# Patient Record
Sex: Male | Born: 1945 | Race: White | Hispanic: No | Marital: Married | Smoking: Former smoker
Health system: Southern US, Community
[De-identification: ages and names within clinical notes are randomized; demographics above are authoritative.]

## PROBLEM LIST (undated history)

## (undated) DIAGNOSIS — L920 Granuloma annulare: Secondary | ICD-10-CM

## (undated) DIAGNOSIS — I10 Essential (primary) hypertension: Secondary | ICD-10-CM

## (undated) DIAGNOSIS — E785 Hyperlipidemia, unspecified: Secondary | ICD-10-CM

## (undated) DIAGNOSIS — T7840XA Allergy, unspecified, initial encounter: Secondary | ICD-10-CM

## (undated) DIAGNOSIS — D229 Melanocytic nevi, unspecified: Secondary | ICD-10-CM

## (undated) DIAGNOSIS — C4491 Basal cell carcinoma of skin, unspecified: Secondary | ICD-10-CM

## (undated) DIAGNOSIS — R609 Edema, unspecified: Secondary | ICD-10-CM

## (undated) HISTORY — DX: Melanocytic nevi, unspecified: D22.9

## (undated) HISTORY — PX: NASAL SEPTUM SURGERY: SHX37

## (undated) HISTORY — DX: Hyperlipidemia, unspecified: E78.5

## (undated) HISTORY — DX: Edema, unspecified: R60.9

## (undated) HISTORY — DX: Granuloma annulare: L92.0

## (undated) HISTORY — PX: TONSILLECTOMY: SUR1361

## (undated) HISTORY — DX: Essential (primary) hypertension: I10

## (undated) HISTORY — DX: Allergy, unspecified, initial encounter: T78.40XA

---
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
## 2002-02-06 HISTORY — PX: CERVICAL DISC SURGERY: SHX588

## 2002-03-11 ENCOUNTER — Encounter: Payer: Self-pay | Admitting: Internal Medicine

## 2002-03-11 ENCOUNTER — Encounter: Admission: RE | Admit: 2002-03-11 | Discharge: 2002-03-11 | Payer: Self-pay | Admitting: Internal Medicine

## 2002-06-03 HISTORY — PX: COLONOSCOPY: SHX174

## 2003-08-21 ENCOUNTER — Encounter: Admission: RE | Admit: 2003-08-21 | Discharge: 2003-08-21 | Payer: Self-pay | Admitting: Internal Medicine

## 2003-08-24 ENCOUNTER — Encounter: Admission: RE | Admit: 2003-08-24 | Discharge: 2003-08-24 | Payer: Self-pay | Admitting: Internal Medicine

## 2004-09-14 ENCOUNTER — Encounter: Admission: RE | Admit: 2004-09-14 | Discharge: 2004-09-14 | Payer: Self-pay | Admitting: Internal Medicine

## 2004-10-20 ENCOUNTER — Ambulatory Visit (HOSPITAL_COMMUNITY): Admission: RE | Admit: 2004-10-20 | Discharge: 2004-10-20 | Payer: Self-pay | Admitting: Orthopedic Surgery

## 2004-10-20 ENCOUNTER — Ambulatory Visit (HOSPITAL_BASED_OUTPATIENT_CLINIC_OR_DEPARTMENT_OTHER): Admission: RE | Admit: 2004-10-20 | Discharge: 2004-10-20 | Payer: Self-pay | Admitting: Orthopedic Surgery

## 2005-04-20 ENCOUNTER — Ambulatory Visit (HOSPITAL_COMMUNITY): Admission: RE | Admit: 2005-04-20 | Discharge: 2005-04-20 | Payer: Self-pay | Admitting: Orthopedic Surgery

## 2005-06-06 ENCOUNTER — Inpatient Hospital Stay (HOSPITAL_COMMUNITY): Admission: RE | Admit: 2005-06-06 | Discharge: 2005-06-07 | Payer: Self-pay | Admitting: Neurosurgery

## 2006-02-06 HISTORY — PX: KNEE SURGERY: SHX244

## 2008-01-06 ENCOUNTER — Ambulatory Visit: Payer: Self-pay | Admitting: Internal Medicine

## 2008-03-20 ENCOUNTER — Ambulatory Visit: Payer: Self-pay | Admitting: Internal Medicine

## 2008-09-24 ENCOUNTER — Ambulatory Visit: Payer: Self-pay | Admitting: Internal Medicine

## 2009-03-15 ENCOUNTER — Ambulatory Visit: Payer: Self-pay | Admitting: Internal Medicine

## 2009-04-29 ENCOUNTER — Ambulatory Visit (HOSPITAL_BASED_OUTPATIENT_CLINIC_OR_DEPARTMENT_OTHER): Admission: RE | Admit: 2009-04-29 | Discharge: 2009-04-30 | Payer: Self-pay | Admitting: Orthopedic Surgery

## 2009-04-29 HISTORY — PX: SHOULDER ARTHROSCOPY: SHX128

## 2009-07-12 ENCOUNTER — Ambulatory Visit: Payer: Self-pay | Admitting: Internal Medicine

## 2009-07-14 ENCOUNTER — Ambulatory Visit: Payer: Self-pay | Admitting: Internal Medicine

## 2010-03-01 ENCOUNTER — Ambulatory Visit
Admission: RE | Admit: 2010-03-01 | Discharge: 2010-03-01 | Payer: Self-pay | Source: Home / Self Care | Attending: Internal Medicine | Admitting: Internal Medicine

## 2010-05-01 LAB — BASIC METABOLIC PANEL
BUN: 12 mg/dL (ref 6–23)
Calcium: 9.2 mg/dL (ref 8.4–10.5)
Creatinine, Ser: 0.79 mg/dL (ref 0.4–1.5)

## 2010-05-01 LAB — POCT HEMOGLOBIN-HEMACUE: Hemoglobin: 18 g/dL — ABNORMAL HIGH (ref 13.0–17.0)

## 2010-06-24 NOTE — Op Note (Signed)
NAMEHERACLIO, [name]               ACCOUNT NO.:  [id number]   MEDICAL RECORD NO.:  [id number]          PATIENT TYPE:  INP   LOCATION:  3011                         FACILITY:  [facility]   PHYSICIAN:  Payton Doughty, M.D.      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   OPERATIVE PROCEDURE:  C4-5 and C5-6 anterior cervical decompression and  fusion with a reflex hybrid plate.   DICTATING DOCTOR:  Payton Doughty, M.D.   SERVICE:  Neurosurgery.   ANESTHESIA:  General endotracheal.   PREPARATION:  Prepped with alcohol wipe.   COMPLICATIONS:  None.   No assistant.   A 65 year old gentleman with disk disease at 4-5 and 5-6.  Taken to the  operating room __________ intubated, placed supine on the operating table in  the halter head traction.  Following shaving, prep and drape in the usual  sterile fashion, the skin was incised from the midline to the medial border  of the sternocleidomastoid muscle, slightly above the level of carotid  tubercle.  The platysma was identified, elevated, divided and undermined.  The sternocleidomastoid was identified.  Medial dissection revealed the  carotid artery retracted on the left, trachea and esophagus retracted  laterally on the right.  This exposed the bones on the anterior cervical  spine.  Markers were placed, intraoperative x-ray obtained to confirm  pharynx level.  Having confirmed pharynx level, the longus coli was taken  down bilaterally and the shadow line self-retaining retractor placed.  Diskectomy was carried out at 4-5 and 5-6 under gross observation.  The  operating microscope was then brought in.  We used microdissection technique  to remove the remaining disk to dissect the neuroforamen and we divided the  posterior longitudinal  ligament.  At 4-5, there was disk mostly off to the  left side with osteophyte bilaterally.  At 5-6, the disk was more off to the  right.  Following complete dissection of both neuroforamen at each level,  the wound was irrigated, hemostasis assured.  The 7 mm bone grafts were  fashioned at patellar allograft and tapped into place at each level.  A 34  mm reflex hybrid plate was then placed with 12 mm screws, two in C4, two in  C5, and two in C6.  Intraoperative x-rays showed good placement of bone  grafts, plate and screws.  The wound was once again irrigated and hemostasis  ensured.  The platysma and subcutaneous tissues were reapproximated with #3-  0 Vicryl in an interrupted fashion.  The skin was closed with #4-0 Vicryl in a running subcuticular fashion.  Benzoin and Steri-Strips were placed, __________ Telfa and Op-Site and the  patient placed in an Aspen collar and returned to the recovery room in good  condition.           ______________________________  Payton Doughty, M.D.  MWR/MEDQ  D:  06/06/2005  T:  06/06/2005  Job:  [id number]

## 2010-06-24 NOTE — H&P (Signed)
Harold [name], Harold [name]               ACCOUNT NO.:  [id number]   MEDICAL RECORD NO.:  [id number]          PATIENT TYPE:  INP   LOCATION:  NA                           FACILITY:  [facility]   PHYSICIAN:  Payton Doughty, M.D.      DATE OF BIRTH:  1946-01-11   DATE OF ADMISSION:  06/06/2005  DATE OF DISCHARGE:                                HISTORY & PHYSICAL   ADMISSION DIAGNOSIS:  Cervical spondylosis at C4-5, C5-6.   BODY OF TEXT:  A very nice 65 year old right-handed white gentleman who  sometimes has been having pain out toward his right shoulder and it has  probably been a couple of months.  Tingling and dysesthesias in the right  arm are present.  MR demonstrates spondylosis at C5-6 and C4-5, and he is  now admitted for an anterior decompression and fusion.   PAST MEDICAL HISTORY:  Benign.   He uses Zoloft 50 mg a day, Ritalin 10 mg a day on a p.r.n. basis, Tranxene  7.5 mg on a p.r.n. basis, loratadine 10 mg on a p.r.n. basis, __________ on  a p.r.n. basis.   He has no allergies.   SURGICAL HISTORY:  Medial meniscus by Dr. Thurston Hole in September 2006.   SOCIAL HISTORY:  He does not smoke, is a social drinker, and Museum/gallery conservator.   FAMILY HISTORY:  His mother and father are both deceased.   REVIEW OF SYSTEMS:  Remarkable for glasses, leg pain, jaundice, back pain,  neck pain, arm pain.   PHYSICAL EXAMINATION:  HEENT:  Within normal limits.  NECK:  He has limited range of motion of his neck turning toward the right.  CHEST:  Clear.  CARDIAC:  Regular rate and rhythm.  ABDOMEN:  Nontender with no hepatosplenomegaly.  EXTREMITIES:  Without clubbing or cyanosis.  GENITOURINARY:  Deferred.  VASCULAR:  Peripheral pulses are good.  NEUROLOGIC:  He is awake, alert and oriented.  His cranial nerves are  intact.  Motor exam shows 5/5 strength throughout the upper extremities.  Sensory dysesthesia is described in a right C6 distribution.  Deep tendon  reflexes are 1 at the right biceps,  2 at the left, 2 at the left triceps, 1  at the right triceps, bilaterally 1 at the brachioradialis.  Hoffman's is  negative.   He comes accompanied with an MRI that demonstrates spondylitic disease,  worse to the left at C4-5, worse to the right at C5-6, both with neural  foraminal compromise and rotation and flattening of the cord.   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   PLAN:  Anterior cervical decompression and fusion at C4-5 and C5-6.  The  risks and benefits of this approach have been discussed with him, and he  wishes to proceed.           ______________________________  Payton Doughty, M.D.     MWR/MEDQ  D:  06/06/2005  T:  06/06/2005  Job:  [id number]

## 2010-06-24 NOTE — Op Note (Signed)
NAMEJUSTAN, GAEDE               ACCOUNT NO.:  [id number]   MEDICAL RECORD NO.:  [id number]          PATIENT TYPE:  AMB   LOCATION:  DSC                          FACILITY:  [facility]   PHYSICIAN:  Loreta Ave, M.D. DATE OF BIRTH:  26-Feb-1945   DATE OF PROCEDURE:  10/20/2004  DATE OF DISCHARGE:                                 OPERATIVE REPORT   PREOPERATIVE DIAGNOSIS:  Right knee medial and lateral meniscal tear.   POSTOPERATIVE DIAGNOSIS:  Right knee medial and lateral meniscal tear.  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   OPERATION PERFORMED:  Right knee examination under anesthesia, arthroscopy,  partial medial and lateral meniscectomy.  Chondroplasty and microfracturing  of medial femoral condyle.   SURGEON:  Loreta Ave, M.D.   ASSISTANT:  Genene Churn. Denton Meek.   ANESTHESIA:  General.   ESTIMATED BLOOD LOSS:  Minimal.   SPECIMENS:  None.   CULTURES:  None.   COMPLICATIONS:  None.   DRESSING:  Soft compressive.   DESCRIPTION OF PROCEDURE:  The patient was brought to the operating room and  after adequate anesthesia had been obtained, the knee examined.  Full  motion.  Good stability.  Positive medial McMurray's.  Tourniquet and leg  holder applied.  Leg prepped and draped in the usual sterile fashion.  Three  portals were created, one superolateral, one each medial and lateral  parapatellar.  Inflow catheter introduced.  Knee distended. Arthroscope  introduced, knee inspected.  Patellofemoral joint minimal chondral change.  Large fibrotic medial plica resected.  Hypertrophic synovitis removed.  Lateral meniscus small radial tear, midportion debrided.  Lateral  compartment only had grade 1 changes, nothing marked.  Cruciate ligament was  intact.  Medial meniscus extensive complex tearing entire posterior half  numerous displaced fragments and degeneration.   Posterior half removed,  tapered in to remaining meniscus.  Plateau looked reasonably good, only  grade 2 changes but the condyle had extensive grade 3 and 4 changes over the  entire lateral half through full motion.  Chondral flaps and loose fragments  debrided.  Exposed bone over much of this area.  Treated with multiple  microfracturing.  Pressure reduced in the knee to confirm good bleeding out  of the microfracturing holes.  Pressure increased and all loose fragments  removed  throughout the knee.  Instruments and fluid removed.  Portals of the knee  injected with Marcaine.  Portals were closed with 4-0 nylon.  Sterile  compressive dressing applied.  Anesthesia reversed.  Brought to recovery  room.  Tolerated surgery well.  No complications.      Loreta Ave, M.D.  Electronically Signed     DFM/MEDQ  D:  10/20/2004  T:  10/20/2004  Job:  [id number]

## 2010-09-02 ENCOUNTER — Other Ambulatory Visit: Payer: Self-pay

## 2010-09-02 MED ORDER — HYZAAR 100-25 MG PO TABS: 1.0000 | ORAL_TABLET | Freq: Every day | ORAL | Status: DC

## 2010-09-19 ENCOUNTER — Other Ambulatory Visit: Payer: Self-pay | Admitting: *Deleted

## 2010-09-20 MED ORDER — METOPROLOL TARTRATE 50 MG PO TABS: 50.0000 mg | ORAL_TABLET | Freq: Every day | ORAL | Status: DC

## 2010-12-13 ENCOUNTER — Other Ambulatory Visit: Payer: Self-pay

## 2010-12-13 MED ORDER — HYZAAR 100-25 MG PO TABS: 1.0000 | ORAL_TABLET | Freq: Every day | ORAL | Status: DC

## 2010-12-21 ENCOUNTER — Other Ambulatory Visit: Payer: Self-pay

## 2010-12-21 DIAGNOSIS — H109 Unspecified conjunctivitis: Secondary | ICD-10-CM

## 2010-12-21 DIAGNOSIS — B009 Herpesviral infection, unspecified: Secondary | ICD-10-CM | POA: Insufficient documentation

## 2010-12-21 DIAGNOSIS — I10 Essential (primary) hypertension: Secondary | ICD-10-CM | POA: Insufficient documentation

## 2010-12-21 DIAGNOSIS — F419 Anxiety disorder, unspecified: Secondary | ICD-10-CM | POA: Insufficient documentation

## 2010-12-21 MED ORDER — ACYCLOVIR 5 % EX CREA: 1.0000 "application " | TOPICAL_CREAM | CUTANEOUS | Status: AC

## 2010-12-21 NOTE — Telephone Encounter (Signed)
Pt called complaining of conjunctivitis symptoms this afternoon. Call in to [facility] Ofloxacin opthalmic drops Sig:2 drops o.u. 4 times daily x 5 days. If no better in 48 hours is to see eye doctor. MJB

## 2011-03-17 ENCOUNTER — Other Ambulatory Visit: Payer: Self-pay | Admitting: Internal Medicine

## 2011-03-17 ENCOUNTER — Encounter: Payer: Self-pay | Admitting: Internal Medicine

## 2011-03-20 ENCOUNTER — Ambulatory Visit (INDEPENDENT_AMBULATORY_CARE_PROVIDER_SITE_OTHER): Payer: PRIVATE HEALTH INSURANCE | Admitting: Internal Medicine

## 2011-03-20 VITALS — BP 116/72 | HR 80 | Resp 12 | Ht 73.5 in | Wt 277.0 lb

## 2011-03-20 DIAGNOSIS — Z23 Encounter for immunization: Secondary | ICD-10-CM

## 2011-03-20 DIAGNOSIS — I10 Essential (primary) hypertension: Secondary | ICD-10-CM

## 2011-03-20 DIAGNOSIS — Z0000 Encounter for general adult medical examination without abnormal findings: Secondary | ICD-10-CM

## 2011-03-20 LAB — POCT URINALYSIS DIPSTICK
Ketones, UA: NEGATIVE
Leukocytes, UA: NEGATIVE
Protein, UA: NEGATIVE
Spec Grav, UA: 1.025
Urobilinogen, UA: NEGATIVE
pH, UA: 6

## 2011-03-20 LAB — COMPREHENSIVE METABOLIC PANEL
Albumin: 4.4 g/dL (ref 3.5–5.2)
Calcium: 10.1 mg/dL (ref 8.4–10.5)
Chloride: 101 mEq/L (ref 96–112)
Potassium: 4.4 mEq/L (ref 3.5–5.3)
Total Bilirubin: 1.1 mg/dL (ref 0.3–1.2)
Total Protein: 6.5 g/dL (ref 6.0–8.3)

## 2011-03-20 LAB — CBC WITH DIFFERENTIAL/PLATELET
Basophils Absolute: 0 10*3/uL (ref 0.0–0.1)
Basophils Relative: 1 % (ref 0–1)
Eosinophils Absolute: 0.6 10*3/uL (ref 0.0–0.7)
Eosinophils Relative: 8 % — ABNORMAL HIGH (ref 0–5)
Lymphocytes Relative: 36 % (ref 12–46)
Lymphs Abs: 2.7 10*3/uL (ref 0.7–4.0)
MCH: 31.7 pg (ref 26.0–34.0)
MCHC: 34.6 g/dL (ref 30.0–36.0)
Monocytes Relative: 10 % (ref 3–12)
Neutro Abs: 3.5 10*3/uL (ref 1.7–7.7)
Neutrophils Relative %: 46 % (ref 43–77)
Platelets: 244 10*3/uL (ref 150–400)

## 2011-03-20 LAB — LIPID PANEL
HDL: 44 mg/dL (ref >39)
Triglycerides: 137 mg/dL (ref <150)
VLDL: 27 mg/dL (ref 0–40)

## 2011-03-31 ENCOUNTER — Other Ambulatory Visit: Payer: Self-pay

## 2011-03-31 MED ORDER — VALACYCLOVIR HCL 500 MG PO TABS: 500.0000 mg | ORAL_TABLET | Freq: Two times a day (BID) | ORAL | Status: AC

## 2011-04-13 ENCOUNTER — Other Ambulatory Visit: Payer: Self-pay

## 2011-04-13 MED ORDER — METOPROLOL TARTRATE 50 MG PO TABS: 50.0000 mg | ORAL_TABLET | Freq: Every day | ORAL | Status: DC

## 2011-05-08 ENCOUNTER — Encounter: Payer: Self-pay | Admitting: Internal Medicine

## 2011-05-08 NOTE — Progress Notes (Signed)
  Subjective:    Patient ID: Harold [name], male    DOB: May 07, 1945, 66 y.o.   MRN: [id number]  HPI and 66 year old white male for welcome to Medicare physical examination. History of hypertension, hyperlipidemia, vitamin D deficiency, allergic rhinitis. History of granuloma annulare. History of hepatitis A 1965, history of fractured nose age 5. Tonsillectomy 1952, had a birth mark removed in the remote past, septal deviation surgery by Dr. Margit Banda February 2004, cervical disc surgery April 2007. Had colonoscopy April 2004. Blood pressure is well-controlled on beta blocker and Hyzaar. History of anxiety. History of herpes simplex type I.  He has a health care power of attorney.  He is president of, [employer]. He is married. One son in good health.  Father died at age 27 of an MI with history of diabetes and hypertension. Mother died of Alzheimer's disease complications. One brother in good health. One sister with nephrotic syndrome. Patient is a Buyer, retail of [employer]. Enjoys golf. Social alcohol consumption. Does not smoke. He used to smoke but quit proximally 1999.    Review of Systems noncontributory. Feels well.     Objective:   Physical Exam HEENT exam: Extraocular movements are full, pharynx is clear, TMs are clear, PERRLA, neck: Supple without thyromegaly or carotid bruits or adenopathy. Chest clear to auscultation; cardiac exam regular rate and rhythm normal S1 and S2 without murmurs or gallops; abdomen no hepatosplenomegaly masses or tenderness; prostate exam is normal; extremities without edema; neuro no focal deficits.        Assessment & Plan:  Hypertension  History of hyperlipidemia  History of vitamin D deficiency  History of allergic rhinitis  History of granuloma annular he  Plan: Return in 6 months for office visit, lipid panel blood pressure check. Recommend diet and exercise.

## 2011-05-08 NOTE — Patient Instructions (Signed)
Please try the diet exercise and lose a bit of weight. Return in 6 months.

## 2011-05-16 ENCOUNTER — Other Ambulatory Visit: Payer: Self-pay

## 2011-05-16 MED ORDER — HYZAAR 100-25 MG PO TABS: 1.0000 | ORAL_TABLET | Freq: Every day | ORAL | Status: DC

## 2011-06-20 ENCOUNTER — Encounter: Payer: Self-pay | Admitting: Internal Medicine

## 2011-06-20 ENCOUNTER — Ambulatory Visit (INDEPENDENT_AMBULATORY_CARE_PROVIDER_SITE_OTHER): Payer: PRIVATE HEALTH INSURANCE | Admitting: Internal Medicine

## 2011-06-20 VITALS — BP 128/76 | HR 92 | Temp 98.1°F | Wt 272.0 lb

## 2011-06-20 DIAGNOSIS — J069 Acute upper respiratory infection, unspecified: Secondary | ICD-10-CM

## 2011-06-20 NOTE — Progress Notes (Signed)
  Subjective:    Patient ID: Harold [name], male    DOB: [birth year], 66 y.o.   MRN: [id number]  HPI Patient has had URI symptoms for 3 weeks. Has cough prickly in the mornings that is sometimes productive. Some runny nose. Symptoms are wearing him down and are not getting any better. No fever or shaking chills. Main complaint is cough and congestion.    Review of Systems     Objective:   Physical Exam HEENT exam: TMs are clear; pharynx clear; neck is supple without significant adenopathy; chest clear. Sounds nasally congested        Assessment & Plan:  URI  Plan: Levaquin 500 milligrams daily for 7 days. Call back if not better in 7 days.

## 2011-06-20 NOTE — Patient Instructions (Signed)
Take Levaquin 500 milligrams daily for 7 days. Call if not better in one week. 

## 2011-06-22 ENCOUNTER — Ambulatory Visit: Payer: PRIVATE HEALTH INSURANCE | Admitting: Internal Medicine

## 2011-09-21 ENCOUNTER — Encounter: Payer: Self-pay | Admitting: Internal Medicine

## 2011-09-21 ENCOUNTER — Ambulatory Visit (INDEPENDENT_AMBULATORY_CARE_PROVIDER_SITE_OTHER): Payer: PRIVATE HEALTH INSURANCE | Admitting: Internal Medicine

## 2011-09-21 VITALS — BP 124/70 | HR 76 | Temp 97.0°F | Ht 74.0 in | Wt 272.0 lb

## 2011-09-21 DIAGNOSIS — J309 Allergic rhinitis, unspecified: Secondary | ICD-10-CM

## 2011-09-21 DIAGNOSIS — I10 Essential (primary) hypertension: Secondary | ICD-10-CM

## 2011-10-07 NOTE — Progress Notes (Signed)
  Subjective:    Patient ID: Harold [name], male    DOB: 11-Dec-1945, 66 y.o.   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 Blood pressure under good control on current regimen of metoprolol and Hyzaar 100/25 daily. Continues to have some issues with nasal congestion and stuffiness. Was seen for URI in May but never completely cleared up. He may need to have allergy testing with Dr. Beaulah Dinning  who is a friend of his.    Review of Systems     Objective:   Physical Exam He has boggy nasal mucosa. Pharynx is clear. TMs are slightly full bilaterally but not red. Neck is supple without adenopathy. Chest clear to auscultation. Cardiac exam regular rate and rhythm normal S1 and S2. Extremities without edema. Skin is warm and dry.        Assessment & Plan:  Allergic rhinitis  Hypertension-well-controlled on current regimen  Plan: Recommend allergy testing with Dr. Lavinia Sharps as in [facility]. Patient will call for appointment. Return in 6 months for physical examination.

## 2011-10-07 NOTE — Patient Instructions (Addendum)
Recommend allergy testing. Continue same medications and return in 6 months.

## 2012-01-12 ENCOUNTER — Telehealth: Payer: Self-pay

## 2012-01-12 DIAGNOSIS — N529 Male erectile dysfunction, unspecified: Secondary | ICD-10-CM

## 2012-01-12 MED ORDER — TADALAFIL 5 MG PO TABS: 5.0000 mg | ORAL_TABLET | Freq: Every day | ORAL | Status: DC | PRN

## 2012-01-12 NOTE — Telephone Encounter (Signed)
Patient requesting refill of Cialis prescribed by his urologist, who will not fill it without an OV. OK per Dr. Lenord Fellers to refill Cialis 20mg  as dir, with 2 refills

## 2012-03-28 ENCOUNTER — Other Ambulatory Visit: Payer: PRIVATE HEALTH INSURANCE | Admitting: Internal Medicine

## 2012-03-28 ENCOUNTER — Other Ambulatory Visit: Payer: Self-pay | Admitting: Internal Medicine

## 2012-03-28 DIAGNOSIS — Z0000 Encounter for general adult medical examination without abnormal findings: Secondary | ICD-10-CM

## 2012-03-28 LAB — COMPREHENSIVE METABOLIC PANEL
ALT: 25 U/L (ref 0–53)
AST: 17 U/L (ref 0–37)
Albumin: 4.2 g/dL (ref 3.5–5.2)
BUN: 23 mg/dL (ref 6–23)
Calcium: 9.6 mg/dL (ref 8.4–10.5)
Creat: 0.9 mg/dL (ref 0.50–1.35)
Sodium: 139 mEq/L (ref 135–145)
Total Protein: 6.5 g/dL (ref 6.0–8.3)

## 2012-03-28 LAB — CBC WITH DIFFERENTIAL/PLATELET
Basophils Absolute: 0 10*3/uL (ref 0.0–0.1)
Eosinophils Absolute: 0.3 10*3/uL (ref 0.0–0.7)
HCT: 47.2 % (ref 39.0–52.0)
Hemoglobin: 16.7 g/dL (ref 13.0–17.0)
Lymphocytes Relative: 41 % (ref 12–46)
MCV: 90.4 fL (ref 78.0–100.0)
Monocytes Absolute: 0.4 10*3/uL (ref 0.1–1.0)
WBC: 5.4 10*3/uL (ref 4.0–10.5)

## 2012-03-28 LAB — LIPID PANEL
Cholesterol: 137 mg/dL (ref 0–200)
HDL: 34 mg/dL — ABNORMAL LOW (ref >39)
LDL Cholesterol: 87 mg/dL (ref 0–99)

## 2012-03-29 ENCOUNTER — Ambulatory Visit (INDEPENDENT_AMBULATORY_CARE_PROVIDER_SITE_OTHER): Payer: PRIVATE HEALTH INSURANCE | Admitting: Internal Medicine

## 2012-03-29 ENCOUNTER — Encounter: Payer: Self-pay | Admitting: Internal Medicine

## 2012-03-29 VITALS — BP 114/68 | HR 68 | Temp 97.5°F | Ht 73.5 in | Wt 258.0 lb

## 2012-03-29 DIAGNOSIS — I10 Essential (primary) hypertension: Secondary | ICD-10-CM

## 2012-03-29 DIAGNOSIS — Z23 Encounter for immunization: Secondary | ICD-10-CM

## 2012-03-29 DIAGNOSIS — F411 Generalized anxiety disorder: Secondary | ICD-10-CM

## 2012-03-29 DIAGNOSIS — E669 Obesity, unspecified: Secondary | ICD-10-CM

## 2012-03-29 DIAGNOSIS — B009 Herpesviral infection, unspecified: Secondary | ICD-10-CM

## 2012-03-29 DIAGNOSIS — J309 Allergic rhinitis, unspecified: Secondary | ICD-10-CM

## 2012-03-29 LAB — HEMOGLOBIN A1C
Hgb A1c MFr Bld: 5.1 % (ref <5.7)
Mean Plasma Glucose: 100 mg/dL (ref <117)

## 2012-03-29 LAB — POCT URINALYSIS DIPSTICK
Glucose, UA: NEGATIVE
Ketones, UA: NEGATIVE
Urobilinogen, UA: NEGATIVE

## 2012-03-29 MED ORDER — TETANUS-DIPHTH-ACELL PERTUSSIS 5-2.5-18.5 LF-MCG/0.5 IM SUSP: 0.5000 mL | Freq: Once | INTRAMUSCULAR | Status: DC

## 2012-04-25 HISTORY — PX: CATARACT EXTRACTION, BILATERAL: SHX1313

## 2012-04-29 ENCOUNTER — Encounter: Payer: Self-pay | Admitting: Internal Medicine

## 2012-05-28 ENCOUNTER — Encounter: Payer: Self-pay | Admitting: Internal Medicine

## 2012-06-22 ENCOUNTER — Other Ambulatory Visit: Payer: Self-pay | Admitting: Internal Medicine

## 2012-08-02 ENCOUNTER — Ambulatory Visit (AMBULATORY_SURGERY_CENTER): Payer: PRIVATE HEALTH INSURANCE | Admitting: *Deleted

## 2012-08-02 ENCOUNTER — Encounter: Payer: Self-pay | Admitting: Internal Medicine

## 2012-08-02 ENCOUNTER — Encounter: Payer: Self-pay | Admitting: Cardiology

## 2012-08-02 VITALS — Ht 74.0 in | Wt 248.4 lb

## 2012-08-02 DIAGNOSIS — Z1211 Encounter for screening for malignant neoplasm of colon: Secondary | ICD-10-CM

## 2012-08-02 MED ORDER — MOVIPREP 100 G PO SOLR: ORAL | Status: DC

## 2012-08-15 ENCOUNTER — Encounter: Payer: Self-pay | Admitting: Internal Medicine

## 2012-08-16 ENCOUNTER — Encounter: Payer: PRIVATE HEALTH INSURANCE | Admitting: Internal Medicine

## 2012-08-18 ENCOUNTER — Encounter: Payer: Self-pay | Admitting: Internal Medicine

## 2012-08-18 DIAGNOSIS — E669 Obesity, unspecified: Secondary | ICD-10-CM | POA: Insufficient documentation

## 2012-08-18 DIAGNOSIS — J309 Allergic rhinitis, unspecified: Secondary | ICD-10-CM | POA: Insufficient documentation

## 2012-08-18 DIAGNOSIS — N529 Male erectile dysfunction, unspecified: Secondary | ICD-10-CM | POA: Insufficient documentation

## 2012-08-18 NOTE — Patient Instructions (Addendum)
Stop metoprolol. Continue Hyzaar. Return in 6 months. Gabapentin will be discontinued. Diet exercise and lose weight.

## 2012-08-18 NOTE — Progress Notes (Signed)
  Subjective:    Patient ID: Harold [name], male    DOB: Jun 24, 1945, 67 y.o.   MRN: [id number]  HPI 67 year old white male with history of hypertension in today for health maintenance exam and evaluation of medical problems. In addition hypertension has hyperlipidemia, vitamin D deficiency, allergic rhinitis. History of anxiety. History of herpes simplex type I.   Past medical history: History of hepatitis A in 1965. History of fractured nose at age 1. Tonsillectomy 1952. Had a birthmark removed in the remote past. Septal deviation surgery February 2004. Cervical disc surgery April 2007. Colonoscopy April 2004.  He has a healthcare power of attorney.  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 Resides in [facility]. He is married. One adult son in good health. Social alcohol consumption. Does not smoke. Used to smoke but quit approximately 1999. He is a Buyer, retail of [employer]. Enjoys golf.  Family history: Father died at age 2 of an MI with history of diabetes and hypertension. Mother died of complications of Alzheimer's disease. One brother in good health. One sister with history of nephrotic syndrome.  Tdap Vaccine 2004. Pneumovax vaccine 2013.    Review of Systems  Constitutional: Negative.   All other systems reviewed and are negative.       Objective:   Physical Exam  Vitals reviewed. Constitutional: He is oriented to person, place, and time. He appears well-developed and well-nourished. No distress.  HENT:  Head: Normocephalic and atraumatic.  Right Ear: External ear normal.  Left Ear: External ear normal.  Mouth/Throat: Oropharynx is clear and moist. No oropharyngeal exudate.  Eyes: Conjunctivae and EOM are normal. Pupils are equal, round, and reactive to light. Right eye exhibits no discharge. Left eye exhibits no discharge. No scleral icterus.  Neck: Neck supple. No JVD present. No thyromegaly present.  Cardiovascular: Normal rate, regular rhythm and normal  heart sounds.   No murmur heard. Pulmonary/Chest: Breath sounds normal. No respiratory distress. He has no wheezes. He has no rales. He exhibits no tenderness.  Abdominal: Soft. Bowel sounds are normal. He exhibits no distension and no mass. There is no tenderness. There is no rebound and no guarding.  Genitourinary: Prostate normal.  Musculoskeletal: Normal range of motion. He exhibits no edema.  Lymphadenopathy:    He has no cervical adenopathy.  Neurological: He is alert and oriented to person, place, and time. He has normal reflexes. No cranial nerve deficit. Coordination normal.  Skin: Skin is warm and dry. No rash noted. He is not diaphoretic.  Psychiatric: He has a normal mood and affect. His behavior is normal. Judgment and thought content normal.          Assessment & Plan:  Hypertension-see below.  Obesity-weight is increased 19 pounds. Needs to diet and exercise.  History of herpes simplex type I  History of anxiety  Erectile dysfunction-continue Cialis daily  Plan: Blood pressure is under good control at present time. Patient will discontinue metoprolol. He is going to stop gabapentin. Continue Hyzaar 100/25 daily. Return in 6 months.

## 2012-08-26 ENCOUNTER — Telehealth: Payer: Self-pay | Admitting: Internal Medicine

## 2012-08-26 NOTE — Telephone Encounter (Signed)
LMOM- I told pt to push his fluids tomorrow- drink a lot of extra water and to also follow his Moviprep instructions exactly.  Also, I told him to call office back tomorrow if he needs his prep instructions given to him

## 2012-08-28 ENCOUNTER — Ambulatory Visit (AMBULATORY_SURGERY_CENTER): Payer: PRIVATE HEALTH INSURANCE | Admitting: Internal Medicine

## 2012-08-28 ENCOUNTER — Encounter: Payer: Self-pay | Admitting: Internal Medicine

## 2012-08-28 VITALS — BP 124/82 | HR 56 | Temp 96.3°F | Resp 19 | Ht 74.0 in | Wt 248.0 lb

## 2012-08-28 DIAGNOSIS — Z1211 Encounter for screening for malignant neoplasm of colon: Secondary | ICD-10-CM

## 2012-08-28 DIAGNOSIS — D126 Benign neoplasm of colon, unspecified: Secondary | ICD-10-CM

## 2012-08-28 MED ORDER — SODIUM CHLORIDE 0.9 % IV SOLN: 500.0000 mL | INTRAVENOUS | Status: DC

## 2012-08-28 NOTE — Progress Notes (Signed)
Called to room to assist during endoscopic procedure.  Patient ID and intended procedure confirmed with present staff. Received instructions for my participation in the procedure from the performing physician.  

## 2012-08-28 NOTE — Progress Notes (Signed)
Patient did not experience any of the following events: a burn prior to discharge; a fall within the facility; wrong site/side/patient/procedure/implant event; or a hospital transfer or hospital admission upon discharge from the facility. (G8907) Patient did not have preoperative order for IV antibiotic SSI prophylaxis. (G8918)  

## 2012-08-28 NOTE — Patient Instructions (Addendum)
Discharge instructions given with verbal understanding. Handout on polyps given. Resume previous medications. YOU HAD AN ENDOSCOPIC PROCEDURE TODAY AT THE [facility]: Refer to the procedure report that was given to you for any specific questions about what was found during the examination.  If the procedure report does not answer your questions, please call your gastroenterologist to clarify.  If you requested that your care partner not be given the details of your procedure findings, then the procedure report has been included in a sealed envelope for you to review at your convenience later.  YOU SHOULD EXPECT: Some feelings of bloating in the abdomen. Passage of more gas than usual.  Walking can help get rid of the air that was put into your GI tract during the procedure and reduce the bloating. If you had a lower endoscopy (such as a colonoscopy or flexible sigmoidoscopy) you may notice spotting of blood in your stool or on the toilet paper. If you underwent a bowel prep for your procedure, then you may not have a normal bowel movement for a few days.  DIET: Your first meal following the procedure should be a light meal and then it is ok to progress to your normal diet.  A half-sandwich or bowl of soup is an example of a good first meal.  Heavy or fried foods are harder to digest and may make you feel nauseous or bloated.  Likewise meals heavy in dairy and vegetables can cause extra gas to form and this can also increase the bloating.  Drink plenty of fluids but you should avoid alcoholic beverages for 24 hours.  ACTIVITY: Your care partner should take you home directly after the procedure.  You should plan to take it easy, moving slowly for the rest of the day.  You can resume normal activity the day after the procedure however you should NOT DRIVE or use heavy machinery for 24 hours (because of the sedation medicines used during the test).    SYMPTOMS TO REPORT IMMEDIATELY: A  gastroenterologist can be reached at any hour.  During normal business hours, 8:30 AM to 5:00 PM Monday through Friday, call [phone].  After hours and on weekends, please call the GI answering service at [phone] who will take a message and have the physician on call contact you.   Following lower endoscopy (colonoscopy or flexible sigmoidoscopy):  Excessive amounts of blood in the stool  Significant tenderness or worsening of abdominal pains  Swelling of the abdomen that is new, acute  Fever of 100F or higher  FOLLOW UP: If any biopsies were taken you will be contacted by phone or by letter within the next 1-3 weeks.  Call your gastroenterologist if you have not heard about the biopsies in 3 weeks.  Our staff will call the home number listed on your records the next business day following your procedure to check on you and address any questions or concerns that you may have at that time regarding the information given to you following your procedure. This is a courtesy call and so if there is no answer at the home number and we have not heard from you through the emergency physician on call, we will assume that you have returned to your regular daily activities without incident.  SIGNATURES/CONFIDENTIALITY: You and/or your care partner have signed paperwork which will be entered into your electronic medical record.  These signatures attest to the fact that that the information above on your After Visit Summary has   been reviewed and is understood.  Full responsibility of the confidentiality of this discharge information lies with you and/or your care-partner. 

## 2012-08-28 NOTE — Op Note (Signed)
[facility] [address] [place], [address]   COLONOSCOPY PROCEDURE REPORT  PATIENT: [name], Harold  MR#: [id number] BIRTHDATE: 1945-06-21 , 66  yrs. old GENDER: Male ENDOSCOPIST: Hart Carwin, MD REFERRED BY:  Sharlet Salina, M.D. PROCEDURE DATE:  08/28/2012 PROCEDURE:   Colonoscopy with snare polypectomy ASA CLASS:   Class II INDICATIONS:Average risk patient for colon cancer and last colonoscopy 2004, unable to see cecal pouch due to redundant colon.  MEDICATIONS: MAC sedation, administered by CRNA and propofol (Diprivan) 400mg  IV  DESCRIPTION OF PROCEDURE:   After the risks and benefits and of the procedure were explained, informed consent was obtained.  A digital rectal exam revealed no abnormalities of the rectum.    The LB [id number]  endoscope was introduced through the anus and advanced to the cecum, which was identified by both the appendix and ileocecal valve .  The quality of the prep was excellent, using MoviPrep .  The instrument was then slowly withdrawn as the colon was fully examined.     COLON FINDINGS: A sessile polyp ranging between 3-35mm in size with a friable surface was found in the ascending colon.  A polypectomy was performed with a cold snare.  The resection was complete and the polyp tissue was completely retrieved.     Retroflexed views revealed no abnormalities.     The scope was then withdrawn from the patient and the procedure completed.  COMPLICATIONS: There were no complications. ENDOSCOPIC IMPRESSION: Sessile polyp ranging between 3-19mm in size was found in the ascending colon; polypectomy was performed with a cold snare  RECOMMENDATIONS: 1.  Await pathology results 2.  High fiber diet   REPEAT EXAM: for Colonoscopy, pending biopsy results.  cc:  _______________________________ eSignedHart Carwin, MD 08/28/2012 11:58 AM     PATIENT NAME:  Harold [name] MR#: [id number]

## 2012-08-30 ENCOUNTER — Telehealth: Payer: Self-pay

## 2012-08-30 NOTE — Telephone Encounter (Signed)
  Follow up Call-  Call back number 08/28/2012  Post procedure Call Back phone  # [phone]  Permission to leave phone message Yes     Patient questions:  Do you have a fever, pain , or abdominal swelling? no Pain Score  0 *  Have you tolerated food without any problems? yes  Have you been able to return to your normal activities? yes  Do you have any questions about your discharge instructions: Diet   no Medications  no Follow up visit  no  Do you have questions or concerns about your Care? no  Actions: * If pain score is 4 or above: No action needed, pain <4.

## 2012-09-02 ENCOUNTER — Encounter: Payer: Self-pay | Admitting: Internal Medicine

## 2013-02-14 ENCOUNTER — Other Ambulatory Visit: Payer: Self-pay

## 2013-02-14 MED ORDER — OMEPRAZOLE 20 MG PO CPDR: 20.0000 mg | DELAYED_RELEASE_CAPSULE | Freq: Two times a day (BID) | ORAL | Status: DC

## 2013-06-13 ENCOUNTER — Other Ambulatory Visit: Payer: Self-pay | Admitting: Internal Medicine

## 2013-07-01 ENCOUNTER — Other Ambulatory Visit: Payer: Self-pay | Admitting: Internal Medicine

## 2013-07-01 NOTE — Telephone Encounter (Signed)
Past due for CPE. Last Feb 2014. OK to refill until CPE appt. Will need to contact him.

## 2013-07-21 ENCOUNTER — Other Ambulatory Visit: Payer: Self-pay | Admitting: Internal Medicine

## 2013-07-29 ENCOUNTER — Ambulatory Visit (INDEPENDENT_AMBULATORY_CARE_PROVIDER_SITE_OTHER): Payer: PRIVATE HEALTH INSURANCE | Admitting: Internal Medicine

## 2013-07-29 ENCOUNTER — Encounter: Payer: Self-pay | Admitting: Internal Medicine

## 2013-07-29 VITALS — BP 120/76 | HR 72 | Temp 98.3°F | Ht 72.0 in | Wt 257.0 lb

## 2013-07-29 DIAGNOSIS — I10 Essential (primary) hypertension: Secondary | ICD-10-CM

## 2013-07-29 DIAGNOSIS — N529 Male erectile dysfunction, unspecified: Secondary | ICD-10-CM | POA: Diagnosis not present

## 2013-07-29 DIAGNOSIS — E785 Hyperlipidemia, unspecified: Secondary | ICD-10-CM

## 2013-07-29 DIAGNOSIS — Z8659 Personal history of other mental and behavioral disorders: Secondary | ICD-10-CM | POA: Diagnosis not present

## 2013-07-29 DIAGNOSIS — Z8639 Personal history of other endocrine, nutritional and metabolic disease: Secondary | ICD-10-CM | POA: Diagnosis not present

## 2013-07-29 DIAGNOSIS — J309 Allergic rhinitis, unspecified: Secondary | ICD-10-CM | POA: Diagnosis not present

## 2013-08-11 ENCOUNTER — Other Ambulatory Visit: Payer: Self-pay | Admitting: Internal Medicine

## 2013-08-12 ENCOUNTER — Other Ambulatory Visit: Payer: Self-pay

## 2013-08-12 MED ORDER — VALACYCLOVIR HCL 500 MG PO TABS: 500.0000 mg | ORAL_TABLET | Freq: Every day | ORAL | Status: DC

## 2013-08-12 NOTE — Telephone Encounter (Signed)
Please see why patient is requesting so many? One a day is enough for prophylaxis.

## 2013-08-12 NOTE — Telephone Encounter (Signed)
He is sharing this rx with his wife. They both take one daily.

## 2013-09-11 ENCOUNTER — Other Ambulatory Visit: Payer: Self-pay | Admitting: Internal Medicine

## 2013-09-26 ENCOUNTER — Other Ambulatory Visit: Payer: Self-pay

## 2013-09-26 ENCOUNTER — Other Ambulatory Visit: Payer: Self-pay | Admitting: Internal Medicine

## 2013-09-26 NOTE — Telephone Encounter (Signed)
Spoke with pharmacist to reiterate that Valacyclovir 500mg  is to be taken daily. Cannot change it to Bid. He once admitted to taking it once daily, and sharing this Rx with his wife. Advised she needs to call us if she needs this.

## 2013-09-29 ENCOUNTER — Telehealth: Payer: Self-pay | Admitting: Internal Medicine

## 2013-09-30 NOTE — Telephone Encounter (Signed)
A refill came through last week but I did not see a prior authorization.

## 2013-10-01 NOTE — Telephone Encounter (Signed)
Informed patient we are awaiting prior auth request by fax today

## 2013-10-02 ENCOUNTER — Telehealth: Payer: Self-pay

## 2013-10-02 NOTE — Telephone Encounter (Signed)
Cialis 5mg  tabs approved by [employer] for one year. Patient advised of this.

## 2013-10-18 ENCOUNTER — Encounter: Payer: Self-pay | Admitting: Internal Medicine

## 2013-10-18 NOTE — Patient Instructions (Signed)
Continue same medications and return in one year. 

## 2013-10-18 NOTE — Progress Notes (Signed)
   Subjective:    Patient ID: Harold [name], male    DOB: Mar 09, 1945, 68 y.o.   MRN: [id number]  HPI  68 year old White Male in today for health maintenance exam and evaluation of medical issues. Has a history of hypertension, hyperlipidemia, vitamin D deficiency, allergic rhinitis, anxiety and history of herpes simplex.  Past medical history: Hepatitis A in 1965. History of fractured nose at age 75. Tonsillectomy 1952. Has had a birthmark removed in the remote past. Septal deviation surgery in February 2004. Cervical disc surgery April 2007.  Colonoscopy April 2004. To be done by Dr. Olevia Perches July 2014.  He has a health care power of attorney.  Social history: He is presently on [place]. Resides in [facility]. He is married. One son in good health. Social alcohol consumption. Does not smoke. Used to smoke but quit approximately 1999. He is a Writer of [employer]. He enjoys golf.  Family history: Father died at age 34 of an MI with history of diabetes and hypertension. Mother died with complications of Alzheimer's disease. One brother in good health. One sister with history of nephrotic syndrome.  Tetanus immunization 2014. Pneumovax immunization 2013. Has been given prescription for Zostavax vaccine.       Review of Systems  Constitutional: Negative.   All other systems reviewed and are negative.      Objective:   Physical Exam  Vitals reviewed. Constitutional: He is oriented to person, place, and time. He appears well-developed and well-nourished. No distress.  HENT:  Head: Normocephalic and atraumatic.  Right Ear: External ear normal.  Left Ear: External ear normal.  Mouth/Throat: Oropharynx is clear and moist. No oropharyngeal exudate.  Eyes: Conjunctivae and EOM are normal. Pupils are equal, round, and reactive to light. Right eye exhibits no discharge. Left eye exhibits no discharge.  Neck: Neck supple. No JVD present. No thyromegaly present.    Cardiovascular: Normal rate, regular rhythm, normal heart sounds and intact distal pulses.   No murmur heard. Pulmonary/Chest: Effort normal and breath sounds normal. He has no wheezes. He has no rales.  Abdominal: Soft. Bowel sounds are normal. He exhibits no distension and no mass. There is no tenderness. There is no rebound and no guarding.  Genitourinary: Prostate normal.  Musculoskeletal: Normal range of motion. He exhibits no edema.  Lymphadenopathy:    He has no cervical adenopathy.  Neurological: He is alert and oriented to person, place, and time. He has normal reflexes. He displays normal reflexes. No cranial nerve deficit. Coordination normal.  Skin: Skin is warm and dry. No rash noted. He is not diaphoretic.  Psychiatric: He has a normal mood and affect. His behavior is normal. Judgment and thought content normal.          Assessment & Plan:  Hypertension-stable-kidney and liver functions are normal. See scan document. Patient had lab work drawn through his office  Hyperlipidemia-LDL is 111. Total cholesterol 168  Vitamin D deficiency-not measured this time and labs he obtained through his office  Allergic rhinitis  History of anxiety  History of herpes simplex type I  Plan: Return in one year or as needed. He will continue to monitor blood pressure at home. Recommend annual influenza immunization.

## 2014-02-12 ENCOUNTER — Telehealth: Payer: Self-pay | Admitting: Internal Medicine

## 2014-02-12 NOTE — Telephone Encounter (Signed)
Cannot locate Rxs previously written for Zostavax vaccine. Written for pt and wife and mailed to them today.

## 2014-06-09 DIAGNOSIS — D239 Other benign neoplasm of skin, unspecified: Secondary | ICD-10-CM | POA: Diagnosis not present

## 2014-06-09 DIAGNOSIS — B070 Plantar wart: Secondary | ICD-10-CM | POA: Diagnosis not present

## 2014-06-09 DIAGNOSIS — L570 Actinic keratosis: Secondary | ICD-10-CM | POA: Diagnosis not present

## 2014-07-21 ENCOUNTER — Other Ambulatory Visit: Payer: Self-pay | Admitting: Internal Medicine

## 2014-07-21 NOTE — Telephone Encounter (Signed)
CPE due after 6/23. Call pt. And refill x 60 days

## 2014-07-21 NOTE — Telephone Encounter (Signed)
Spoke with patient wife she will have patient call to schedule CPE

## 2014-08-17 ENCOUNTER — Other Ambulatory Visit: Payer: Self-pay | Admitting: Internal Medicine

## 2014-08-17 ENCOUNTER — Other Ambulatory Visit: Payer: Medicare Other | Admitting: Internal Medicine

## 2014-08-17 DIAGNOSIS — Z125 Encounter for screening for malignant neoplasm of prostate: Secondary | ICD-10-CM

## 2014-08-17 DIAGNOSIS — R5383 Other fatigue: Secondary | ICD-10-CM | POA: Diagnosis not present

## 2014-08-17 DIAGNOSIS — Z136 Encounter for screening for cardiovascular disorders: Secondary | ICD-10-CM | POA: Diagnosis not present

## 2014-08-17 DIAGNOSIS — IMO0001 Reserved for inherently not codable concepts without codable children: Secondary | ICD-10-CM

## 2014-08-17 DIAGNOSIS — Z13220 Encounter for screening for lipoid disorders: Secondary | ICD-10-CM | POA: Diagnosis not present

## 2014-08-17 DIAGNOSIS — Z131 Encounter for screening for diabetes mellitus: Secondary | ICD-10-CM | POA: Diagnosis not present

## 2014-08-17 DIAGNOSIS — G609 Hereditary and idiopathic neuropathy, unspecified: Secondary | ICD-10-CM | POA: Diagnosis not present

## 2014-08-17 LAB — CBC WITH DIFFERENTIAL/PLATELET
BASOS PCT: 1 % (ref 0–1)
Basophils Absolute: 0.1 10*3/uL (ref 0.0–0.1)
EOS PCT: 4 % (ref 0–5)
Eosinophils Absolute: 0.2 10*3/uL (ref 0.0–0.7)
HEMATOCRIT: 45.8 % (ref 39.0–52.0)
Hemoglobin: 16 g/dL (ref 13.0–17.0)
LYMPHS ABS: 1.7 10*3/uL (ref 0.7–4.0)
Lymphocytes Relative: 30 % (ref 12–46)
MCH: 32.5 pg (ref 26.0–34.0)
MCHC: 34.9 g/dL (ref 30.0–36.0)
MCV: 92.9 fL (ref 78.0–100.0)
MONO ABS: 0.5 10*3/uL (ref 0.1–1.0)
MPV: 9.3 fL (ref 8.6–12.4)
Monocytes Relative: 8 % (ref 3–12)
NEUTROS ABS: 3.3 10*3/uL (ref 1.7–7.7)
Neutrophils Relative %: 57 % (ref 43–77)
Platelets: 251 10*3/uL (ref 150–400)
RBC: 4.93 MIL/uL (ref 4.22–5.81)
RDW: 13 % (ref 11.5–15.5)
WBC: 5.8 10*3/uL (ref 4.0–10.5)

## 2014-08-17 LAB — LIPID PANEL
CHOL/HDL RATIO: 3.6 ratio
CHOLESTEROL: 147 mg/dL (ref 0–200)
HDL: 41 mg/dL (ref >=40)
LDL Cholesterol: 89 mg/dL (ref 0–99)
Triglycerides: 83 mg/dL (ref <150)
VLDL: 17 mg/dL (ref 0–40)

## 2014-08-17 LAB — COMPLETE METABOLIC PANEL WITH GFR
ALBUMIN: 3.9 g/dL (ref 3.5–5.2)
ALK PHOS: 53 U/L (ref 39–117)
ALT: 19 U/L (ref 0–53)
AST: 19 U/L (ref 0–37)
BILIRUBIN TOTAL: 0.9 mg/dL (ref 0.2–1.2)
BUN: 10 mg/dL (ref 6–23)
CO2: 29 meq/L (ref 19–32)
Calcium: 9.5 mg/dL (ref 8.4–10.5)
Chloride: 102 mEq/L (ref 96–112)
Creat: 0.71 mg/dL (ref 0.50–1.35)
GFR, Est Non African American: >89 mL/min
GLUCOSE: 99 mg/dL (ref 70–99)
Potassium: 4.3 mEq/L (ref 3.5–5.3)
Sodium: 139 mEq/L (ref 135–145)
TOTAL PROTEIN: 6 g/dL (ref 6.0–8.3)

## 2014-08-18 ENCOUNTER — Encounter: Payer: Self-pay | Admitting: Internal Medicine

## 2014-08-18 ENCOUNTER — Ambulatory Visit (INDEPENDENT_AMBULATORY_CARE_PROVIDER_SITE_OTHER): Payer: Medicare Other | Admitting: Internal Medicine

## 2014-08-18 VITALS — BP 140/80 | HR 80 | Temp 97.9°F | Ht 72.0 in | Wt 236.0 lb

## 2014-08-18 DIAGNOSIS — B009 Herpesviral infection, unspecified: Secondary | ICD-10-CM

## 2014-08-18 DIAGNOSIS — G609 Hereditary and idiopathic neuropathy, unspecified: Secondary | ICD-10-CM

## 2014-08-18 DIAGNOSIS — G5603 Carpal tunnel syndrome, bilateral upper limbs: Secondary | ICD-10-CM

## 2014-08-18 DIAGNOSIS — E785 Hyperlipidemia, unspecified: Secondary | ICD-10-CM

## 2014-08-18 DIAGNOSIS — I10 Essential (primary) hypertension: Secondary | ICD-10-CM | POA: Diagnosis not present

## 2014-08-18 DIAGNOSIS — G5601 Carpal tunnel syndrome, right upper limb: Secondary | ICD-10-CM

## 2014-08-18 DIAGNOSIS — Z0000 Encounter for general adult medical examination without abnormal findings: Secondary | ICD-10-CM | POA: Diagnosis not present

## 2014-08-18 DIAGNOSIS — G5602 Carpal tunnel syndrome, left upper limb: Secondary | ICD-10-CM

## 2014-08-18 DIAGNOSIS — Z23 Encounter for immunization: Secondary | ICD-10-CM | POA: Diagnosis not present

## 2014-08-18 DIAGNOSIS — F411 Generalized anxiety disorder: Secondary | ICD-10-CM | POA: Diagnosis not present

## 2014-08-18 DIAGNOSIS — J309 Allergic rhinitis, unspecified: Secondary | ICD-10-CM

## 2014-08-18 LAB — POCT URINALYSIS DIPSTICK
Bilirubin, UA: NEGATIVE
Blood, UA: NEGATIVE
Glucose, UA: NEGATIVE
KETONES UA: NEGATIVE
Leukocytes, UA: NEGATIVE
Nitrite, UA: NEGATIVE
PH UA: 6
Protein, UA: NEGATIVE
Spec Grav, UA: 1.02
Urobilinogen, UA: NEGATIVE

## 2014-08-18 LAB — PSA, MEDICARE: PSA: 3.04 ng/mL (ref <=4.00)

## 2014-08-18 MED ORDER — TAMSULOSIN HCL 0.4 MG PO CAPS: 0.4000 mg | ORAL_CAPSULE | Freq: Every day | ORAL | Status: DC

## 2014-08-18 NOTE — Patient Instructions (Addendum)
Take both BP meds daily and return in 4 weeks.

## 2014-08-19 LAB — VITAMIN B12: Vitamin B-12: 365 pg/mL (ref 211–911)

## 2014-08-19 LAB — HEMOGLOBIN A1C
Hgb A1c MFr Bld: 5.2 % (ref <5.7)
MEAN PLASMA GLUCOSE: 103 mg/dL (ref <117)

## 2014-09-06 DIAGNOSIS — G5603 Carpal tunnel syndrome, bilateral upper limbs: Secondary | ICD-10-CM | POA: Insufficient documentation

## 2014-09-06 DIAGNOSIS — G609 Hereditary and idiopathic neuropathy, unspecified: Secondary | ICD-10-CM | POA: Insufficient documentation

## 2014-09-06 NOTE — Progress Notes (Signed)
Subjective:    Patient ID: Harold [name], male    DOB: 1945-03-27, 69 y.o.   MRN: [id number]  HPI 69 year old White male recently retired from his company and now on [employer]. He's here today for initial welcome to Medicare exam. Has some numbness and tingling in his feet. This is not new. He has a history of hypertension, hyperlipidemia, vitamin D deficiency, allergic rhinitis, anxiety, history of Herpes simplex.  Past medical history: Hepatitis A in 1965. History of fractured nose at age 48. Tonsillectomy 1952. Has had a birthmark removed in the remote past. Septal deviation surgery in February 2004. Cervical disc surgery April 2007.  Colonoscopy 2014 with tubular adenoma removed from ascending colon by Dr. Olevia Perches.  He has a healthcare power of attorney and has provided [place] with those documents.  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 Resides in [facility]. He is married. Graduate of [employer]. Enjoys golf. One son in good health. Does not smoke. He used to smoke but quit a proximally 1999. Social alcohol consumption.  Family history: Father died at age 79 of an MI with history of diabetes and hypertension. Mother died with complications of Alzheimer's disease. One brother in good health. One sister with history of nephrotic syndrome.  Tells me today he has bilateral numbness in wrist and hands. This is likely bilateral carpal tunnel syndrome. Says he's had this for years. May use bilateral wrist splints at night if desired. Could have injections by hand surgeon.  Review of Systems  Constitutional: Negative.   Neurological:       Complains of some numbness and tingling in    his toes       Objective:   Physical Exam  Constitutional: He is oriented to person, place, and time. He appears well-developed and well-nourished. No distress.  HENT:  Head: Normocephalic and atraumatic.  Right Ear: External ear normal.  Left Ear: External ear normal.    Mouth/Throat: Oropharynx is clear and moist. No oropharyngeal exudate.  Eyes: Conjunctivae and EOM are normal. Pupils are equal, round, and reactive to light. Right eye exhibits no discharge. Left eye exhibits no discharge. No scleral icterus.  Neck: Neck supple. No JVD present. No thyromegaly present.  Cardiovascular: Normal rate, regular rhythm, normal heart sounds and intact distal pulses.   No murmur heard. Pulmonary/Chest: Effort normal and breath sounds normal. No respiratory distress. He has no wheezes. He has no rales.  Abdominal: Soft. Bowel sounds are normal. He exhibits no distension and no mass. There is no tenderness. There is no rebound and no guarding.  Genitourinary: Prostate normal.  Musculoskeletal: He exhibits no edema.  Neurological: He is alert and oriented to person, place, and time. He has normal reflexes. No cranial nerve deficit. Coordination normal.  Intact without focal deficits. Mild decreased pinprick on great toes  Skin: Skin is warm and dry. No rash noted. He is not diaphoretic.  Psychiatric: He has a normal mood and affect. His behavior is normal. Judgment and thought content normal.  Vitals reviewed.         Assessment & Plan:  Mild peripheral neuropathy both great toes. Check B-12 level  Essential hypertension-blood pressures elevated today. Tells me after some discussion that he's not been taking both medications on a regular basis.  History of herpes simplex treated with Valtrex  Anxiety-stable  Allergic rhinitis-stable  Hyperlipidemia- lipid panel is normal  Bilateral carpal tunnel syndrome-may use wrist splints if necessary. Apparently this is not a new  problem  Plan: Take both blood pressure medications regularly return in 4 weeks for repeat blood pressure check.   Subjective:   Patient presents for Medicare Annual/Subsequent preventive examination.  Review Past Medical/Family/Social: See above  Risk Factors  Current exercise  habits: Place Mikey Bussing and is physically active Dietary issues discussed: Low fat low carbohydrate  Cardiac risk factors: Hypertension, hyperlipidemia, family history  Depression Screen  (Note: if answer to either of the following is "Yes", a more complete depression screening is indicated)   Over the past two weeks, have you felt down, depressed or hopeless? No  Over the past two weeks, have you felt little interest or pleasure in doing things? No Have you lost interest or pleasure in daily life? No Do you often feel hopeless? No Do you cry easily over simple problems? No   Activities of Daily Living  In your present state of health, do you have any difficulty performing the following activities?:   Driving? No  Managing money? No  Feeding yourself? No  Getting from bed to chair? No  Climbing a flight of stairs? No  Preparing food and eating?: No  Bathing or showering? No  Getting dressed: No  Getting to the toilet? No  Using the toilet:No  Moving around from place to place: No  In the past year have you fallen or had a near fall?:No  Are you sexually active? Yes Do you have more than one partner? No   Hearing Difficulties: No  Do you often ask people to speak up or repeat themselves? No  Do you experience ringing or noises in your ears? No  Do you have difficulty understanding soft or whispered voices? No  Do you feel that you have a problem with memory? No Do you often misplace items? No    Home Safety:  Do you have a smoke alarm at your residence? Yes Do you have grab bars in the bathroom? Yes Do you have throw rugs in your house? Yes   Cognitive Testing  Alert? Yes Normal Appearance?Yes  Oriented to person? Yes Place? Yes  Time? Yes  Recall of three objects? Yes  Can perform simple calculations? Yes  Displays appropriate judgment?Yes  Can read the correct time from a watch face?Yes   List the Names of Other Physician/Practitioners you currently use:  See  referral list for the physicians patient is currently seeing.     Review of Systems: See above   Objective:     General appearance: Appears stated age and mildly obese  Head: Normocephalic, without obvious abnormality, atraumatic  Eyes: conj clear, EOMi PEERLA  Ears: normal TM's and external ear canals both ears  Nose: Nares normal. Septum midline. Mucosa normal. No drainage or sinus tenderness.  Throat: lips, mucosa, and tongue normal; teeth and gums normal  Neck: no adenopathy, no carotid bruit, no JVD, supple, symmetrical, trachea midline and thyroid not enlarged, symmetric, no tenderness/mass/nodules  No CVA tenderness.  Lungs: clear to auscultation bilaterally  Breasts: normal appearance, no masses or tenderness Heart: regular rate and rhythm, S1, S2 normal, no murmur, click, rub or gallop  Abdomen: soft, non-tender; bowel sounds normal; no masses, no organomegaly  Musculoskeletal: ROM normal in all joints, no crepitus, no deformity, Normal muscle strengthen. Back  is symmetric, no curvature. Skin: Skin color, texture, turgor normal. No rashes or lesions  Lymph nodes: Cervical, supraclavicular, and axillary nodes normal.  Neurologic: CN 2 -12 Normal, Normal symmetric reflexes. Normal coordination and gait  Psych: Alert &  Oriented x 3, Mood appear stable.    Assessment:    Annual wellness medicare exam   Plan:    During the course of the visit the patient was educated and counseled about appropriate screening and preventive services including:   Annual PSA     Patient Instructions (the written plan) was given to the patient.  Medicare Attestation  I have personally reviewed:  The patient's medical and social history  Their use of alcohol, tobacco or illicit drugs  Their current medications and supplements  The patient's functional ability including ADLs,fall risks, home safety risks, cognitive, and hearing and visual impairment  Diet and physical activities    Evidence for depression or mood disorders  The patient's weight, height, BMI, and visual acuity have been recorded in the chart. I have made referrals, counseling, and provided education to the patient based on review of the above and I have provided the patient with a written personalized care plan for preventive services.

## 2014-09-14 ENCOUNTER — Encounter: Payer: Self-pay | Admitting: Internal Medicine

## 2014-09-14 ENCOUNTER — Ambulatory Visit (INDEPENDENT_AMBULATORY_CARE_PROVIDER_SITE_OTHER): Payer: Medicare Other | Admitting: Internal Medicine

## 2014-09-14 VITALS — BP 122/78 | HR 78 | Temp 97.8°F

## 2014-09-14 DIAGNOSIS — I10 Essential (primary) hypertension: Secondary | ICD-10-CM

## 2014-09-14 NOTE — Progress Notes (Signed)
Patient presents today for blood pressure check.

## 2014-09-15 ENCOUNTER — Ambulatory Visit: Payer: Medicare Other | Admitting: Internal Medicine

## 2014-10-14 ENCOUNTER — Other Ambulatory Visit: Payer: Self-pay | Admitting: *Deleted

## 2014-10-14 ENCOUNTER — Telehealth: Payer: Self-pay | Admitting: Internal Medicine

## 2014-10-14 MED ORDER — MONTELUKAST SODIUM 10 MG PO TABS: 10.0000 mg | ORAL_TABLET | Freq: Every day | ORAL | Status: DC

## 2014-10-14 NOTE — Telephone Encounter (Signed)
States he needs a refill on his inhaler:  Singulair  Pharmacy:  [facility]

## 2014-10-14 NOTE — Telephone Encounter (Signed)
Refilled singulair 

## 2014-10-14 NOTE — Telephone Encounter (Signed)
Please refill for one year  

## 2014-10-26 DIAGNOSIS — M7061 Trochanteric bursitis, right hip: Secondary | ICD-10-CM | POA: Diagnosis not present

## 2014-11-05 DIAGNOSIS — M7061 Trochanteric bursitis, right hip: Secondary | ICD-10-CM | POA: Diagnosis not present

## 2014-11-16 ENCOUNTER — Other Ambulatory Visit: Payer: Self-pay | Admitting: Internal Medicine

## 2014-11-27 ENCOUNTER — Telehealth: Payer: Self-pay | Admitting: Internal Medicine

## 2014-11-27 NOTE — Telephone Encounter (Signed)
Patient called to ask for assistance in getting his appointment expedited with [facility].  He has appointment to see Dr Amedeo Plenty on 12/15 @ 8:15.  I called over and spoke with [facility] @ [phone]; she advised they had no information on patient at all.  I was not able to get an appointment any sooner.  I did fax information over to the scheduler for patient and wrote a note on there indicating that patient does have bilateral carpel tunnel syndrome, numbness and pain that awakens him during the night.  LM on the patient's [phone] that I had faxed documentation to [phone] and we may or may not be able to get his appointment moved up.  They will call if they are able to get him in sooner.

## 2014-12-10 ENCOUNTER — Other Ambulatory Visit: Payer: Self-pay | Admitting: Internal Medicine

## 2014-12-12 ENCOUNTER — Other Ambulatory Visit: Payer: Self-pay | Admitting: Internal Medicine

## 2015-01-20 DIAGNOSIS — Z23 Encounter for immunization: Secondary | ICD-10-CM | POA: Diagnosis not present

## 2015-01-21 DIAGNOSIS — G5603 Carpal tunnel syndrome, bilateral upper limbs: Secondary | ICD-10-CM | POA: Diagnosis not present

## 2015-01-21 DIAGNOSIS — M79641 Pain in right hand: Secondary | ICD-10-CM | POA: Diagnosis not present

## 2015-01-21 DIAGNOSIS — G5601 Carpal tunnel syndrome, right upper limb: Secondary | ICD-10-CM | POA: Diagnosis not present

## 2015-01-21 DIAGNOSIS — G5602 Carpal tunnel syndrome, left upper limb: Secondary | ICD-10-CM | POA: Diagnosis not present

## 2015-01-30 DIAGNOSIS — G5601 Carpal tunnel syndrome, right upper limb: Secondary | ICD-10-CM | POA: Diagnosis not present

## 2015-01-30 DIAGNOSIS — G5602 Carpal tunnel syndrome, left upper limb: Secondary | ICD-10-CM | POA: Diagnosis not present

## 2015-02-07 HISTORY — PX: RETINAL DETACHMENT SURGERY: SHX105

## 2015-02-10 DIAGNOSIS — G5603 Carpal tunnel syndrome, bilateral upper limbs: Secondary | ICD-10-CM | POA: Diagnosis not present

## 2015-02-10 DIAGNOSIS — M79641 Pain in right hand: Secondary | ICD-10-CM | POA: Diagnosis not present

## 2015-02-10 DIAGNOSIS — M79642 Pain in left hand: Secondary | ICD-10-CM | POA: Diagnosis not present

## 2015-03-12 ENCOUNTER — Other Ambulatory Visit: Payer: Self-pay | Admitting: Orthopedic Surgery

## 2015-03-12 DIAGNOSIS — D481 Neoplasm of uncertain behavior of connective and other soft tissue: Secondary | ICD-10-CM | POA: Diagnosis not present

## 2015-03-12 DIAGNOSIS — G5601 Carpal tunnel syndrome, right upper limb: Secondary | ICD-10-CM | POA: Diagnosis not present

## 2015-03-12 DIAGNOSIS — R2231 Localized swelling, mass and lump, right upper limb: Secondary | ICD-10-CM | POA: Diagnosis not present

## 2015-03-19 DIAGNOSIS — G5603 Carpal tunnel syndrome, bilateral upper limbs: Secondary | ICD-10-CM | POA: Diagnosis not present

## 2015-03-19 DIAGNOSIS — Z4789 Encounter for other orthopedic aftercare: Secondary | ICD-10-CM | POA: Diagnosis not present

## 2015-03-26 DIAGNOSIS — M79641 Pain in right hand: Secondary | ICD-10-CM | POA: Diagnosis not present

## 2015-04-08 DIAGNOSIS — Z4789 Encounter for other orthopedic aftercare: Secondary | ICD-10-CM | POA: Diagnosis not present

## 2015-04-08 DIAGNOSIS — G5603 Carpal tunnel syndrome, bilateral upper limbs: Secondary | ICD-10-CM | POA: Diagnosis not present

## 2015-05-05 DIAGNOSIS — H04123 Dry eye syndrome of bilateral lacrimal glands: Secondary | ICD-10-CM | POA: Diagnosis not present

## 2015-05-14 DIAGNOSIS — G5602 Carpal tunnel syndrome, left upper limb: Secondary | ICD-10-CM | POA: Diagnosis not present

## 2015-05-20 DIAGNOSIS — Z4789 Encounter for other orthopedic aftercare: Secondary | ICD-10-CM | POA: Diagnosis not present

## 2015-05-26 DIAGNOSIS — G5603 Carpal tunnel syndrome, bilateral upper limbs: Secondary | ICD-10-CM | POA: Diagnosis not present

## 2015-06-09 ENCOUNTER — Other Ambulatory Visit: Payer: Self-pay | Admitting: Internal Medicine

## 2015-06-09 NOTE — Telephone Encounter (Signed)
Due for CPE after July 12th. Please call and book. Ok to refill Cialis x 6 months

## 2015-06-10 DIAGNOSIS — Z4789 Encounter for other orthopedic aftercare: Secondary | ICD-10-CM | POA: Diagnosis not present

## 2015-06-18 ENCOUNTER — Telehealth: Payer: Self-pay | Admitting: Internal Medicine

## 2015-06-18 NOTE — Telephone Encounter (Signed)
Patient called to request that the diagnosis be changed to incontinence for the reason for the prescription for Cialis.  Patient states that he has a problem with urinary retention and incontinence and this medicine, along with Flomax works well for this issue.  Advised that his insurance denied it for dx erectile dysfunction.  However, if prescribed for incontinence, he feels it will cover this drug.  States that the price has increased to $300+.  Advised I would speak with Dr. Renold Genta.    Spoke with Dr. Renold Genta and she advised that she does prescribe Flomax for incontinence, but she doesn't prescribe Cialis for this reason.  She would suggest that the patient see a Urologist for consult in this regard.   Patient called back and advised him of this information.  Patient advised that both drugs together work best for him for this diagnosis.  Advised patient that Dr. Renold Genta stated that he will need a consult to Urologist.  Patient has seen [facility] in the past.  Will work on an appointment with them.  Called [phone]; they are closed this afternoon.  Will call on Monday to work on getting patient an appointment.  Will mail patient appointment date/time.    Also made patient CPE date/time and provided him with that information.  CPE labs 7/14 @ 9:00, CPE 08/23/15 @ 2:00 p.m and patient confirmed dates.  Will mail Urology appointment information to patient when I am able to reach them on the phone on Monday.

## 2015-06-23 ENCOUNTER — Telehealth: Payer: Self-pay | Admitting: Internal Medicine

## 2015-06-23 NOTE — Telephone Encounter (Signed)
Patient had called on 5/12 asking for a prescription for Cialis for urine retention/incontinence.  (see phone encounter for that) Dr. Renold Genta advised to refer patient to Urology.  Spoke with Sarah @ [facility] @ [phone].  Patient was seen by Dr. Thomasene Mohair in 2010.  He cannot be seen by Dr Thomasene Mohair until mid July.  Advised we would take first available.  Patient will be seen on Monday, 06/28/15 by Dr. Estill Dooms.    Spoke with Opal Sidles (wife) and advised of appointment date/time and provide phone number and address of practice.    Dr. Estill Dooms @ [phone] Fax [phone] [address] [place], [place]  [address]  Patient instructed to call the practice and re-schedule if he is unable to keep the appointment on 5/22.  Patient's wife verbalized understanding of these instructions.

## 2015-06-23 NOTE — Telephone Encounter (Deleted)
Spoke with wife and provided appointment date/time

## 2015-06-23 NOTE — Telephone Encounter (Signed)
Patient is upset that Cialis is not covered for him. Pharmacist told office staff that Medicare would not cover this. We have tried at least twice trying to get Cialis covered for him. So far this is been denied. Patient has been a bit verbally abusive to front office personnel. We have made him an appointment with urologist to see if they can be of help in getting this approved. He was not happy with this. We are waiting on another prior authorization attempt at this point in time.

## 2015-06-28 DIAGNOSIS — R339 Retention of urine, unspecified: Secondary | ICD-10-CM | POA: Diagnosis not present

## 2015-06-28 DIAGNOSIS — N401 Enlarged prostate with lower urinary tract symptoms: Secondary | ICD-10-CM | POA: Diagnosis not present

## 2015-06-28 DIAGNOSIS — N138 Other obstructive and reflux uropathy: Secondary | ICD-10-CM | POA: Diagnosis not present

## 2015-08-13 ENCOUNTER — Other Ambulatory Visit: Payer: Self-pay

## 2015-08-13 MED ORDER — VALACYCLOVIR HCL 500 MG PO TABS: 500.0000 mg | ORAL_TABLET | Freq: Every day | ORAL | Status: DC

## 2015-08-20 ENCOUNTER — Other Ambulatory Visit: Payer: Medicare Other | Admitting: Internal Medicine

## 2015-08-20 ENCOUNTER — Other Ambulatory Visit: Payer: Self-pay | Admitting: Internal Medicine

## 2015-08-20 DIAGNOSIS — I10 Essential (primary) hypertension: Secondary | ICD-10-CM

## 2015-08-20 DIAGNOSIS — Z0000 Encounter for general adult medical examination without abnormal findings: Secondary | ICD-10-CM

## 2015-08-20 DIAGNOSIS — E785 Hyperlipidemia, unspecified: Secondary | ICD-10-CM | POA: Diagnosis not present

## 2015-08-20 DIAGNOSIS — Z131 Encounter for screening for diabetes mellitus: Secondary | ICD-10-CM | POA: Diagnosis not present

## 2015-08-20 DIAGNOSIS — Z125 Encounter for screening for malignant neoplasm of prostate: Secondary | ICD-10-CM | POA: Diagnosis not present

## 2015-08-20 LAB — CBC WITH DIFFERENTIAL/PLATELET
BASOS ABS: 62 {cells}/uL (ref 0–200)
Basophils Relative: 1 %
EOS PCT: 2 %
Eosinophils Absolute: 124 cells/uL (ref 15–500)
HCT: 49 % (ref 38.5–50.0)
HEMOGLOBIN: 16.6 g/dL (ref 13.2–17.1)
Lymphocytes Relative: 25 %
Lymphs Abs: 1550 cells/uL (ref 850–3900)
MCH: 31.1 pg (ref 27.0–33.0)
MCHC: 33.9 g/dL (ref 32.0–36.0)
MCV: 91.9 fL (ref 80.0–100.0)
MPV: 9.6 fL (ref 7.5–12.5)
Monocytes Absolute: 558 cells/uL (ref 200–950)
Monocytes Relative: 9 %
NEUTROS PCT: 63 %
Neutro Abs: 3906 cells/uL (ref 1500–7800)
Platelets: 237 10*3/uL (ref 140–400)
RBC: 5.33 MIL/uL (ref 4.20–5.80)
RDW: 13.4 % (ref 11.0–15.0)
WBC: 6.2 10*3/uL (ref 3.8–10.8)

## 2015-08-20 LAB — COMPLETE METABOLIC PANEL WITH GFR
ALBUMIN: 4.1 g/dL (ref 3.6–5.1)
ALT: 19 U/L (ref 9–46)
AST: 19 U/L (ref 10–35)
Alkaline Phosphatase: 60 U/L (ref 40–115)
BUN: 20 mg/dL (ref 7–25)
CO2: 25 mmol/L (ref 20–31)
CREATININE: 0.94 mg/dL (ref 0.70–1.25)
Calcium: 9.6 mg/dL (ref 8.6–10.3)
Chloride: 103 mmol/L (ref 98–110)
GFR, EST NON AFRICAN AMERICAN: 82 mL/min (ref >=60)
GFR, Est African American: >89 mL/min (ref >=60)
Glucose, Bld: 99 mg/dL (ref 65–99)
POTASSIUM: 4 mmol/L (ref 3.5–5.3)
SODIUM: 141 mmol/L (ref 135–146)
Total Bilirubin: 0.6 mg/dL (ref 0.2–1.2)
Total Protein: 6.6 g/dL (ref 6.1–8.1)

## 2015-08-20 LAB — LIPID PANEL
CHOLESTEROL: 135 mg/dL (ref 125–200)
HDL: 49 mg/dL (ref >=40)
LDL CALC: 75 mg/dL (ref <130)
TRIGLYCERIDES: 56 mg/dL (ref <150)
Total CHOL/HDL Ratio: 2.8 Ratio (ref <=5.0)
VLDL: 11 mg/dL (ref <30)

## 2015-08-21 LAB — PSA: PSA: 3.3 ng/mL (ref <=4.00)

## 2015-08-23 ENCOUNTER — Encounter: Payer: Self-pay | Admitting: Internal Medicine

## 2015-08-23 ENCOUNTER — Ambulatory Visit (INDEPENDENT_AMBULATORY_CARE_PROVIDER_SITE_OTHER): Payer: Medicare Other | Admitting: Internal Medicine

## 2015-08-23 VITALS — BP 112/64 | HR 60 | Temp 98.0°F | Resp 18 | Ht 72.0 in | Wt 237.0 lb

## 2015-08-23 DIAGNOSIS — E785 Hyperlipidemia, unspecified: Secondary | ICD-10-CM

## 2015-08-23 DIAGNOSIS — B009 Herpesviral infection, unspecified: Secondary | ICD-10-CM

## 2015-08-23 DIAGNOSIS — Z0000 Encounter for general adult medical examination without abnormal findings: Secondary | ICD-10-CM | POA: Diagnosis not present

## 2015-08-23 DIAGNOSIS — N529 Male erectile dysfunction, unspecified: Secondary | ICD-10-CM

## 2015-08-23 DIAGNOSIS — I10 Essential (primary) hypertension: Secondary | ICD-10-CM

## 2015-08-23 DIAGNOSIS — J309 Allergic rhinitis, unspecified: Secondary | ICD-10-CM | POA: Diagnosis not present

## 2015-08-23 DIAGNOSIS — G609 Hereditary and idiopathic neuropathy, unspecified: Secondary | ICD-10-CM

## 2015-08-23 DIAGNOSIS — F411 Generalized anxiety disorder: Secondary | ICD-10-CM | POA: Diagnosis not present

## 2015-08-23 LAB — POCT URINALYSIS DIPSTICK
Bilirubin, UA: NEGATIVE
Blood, UA: NEGATIVE
Glucose, UA: NEGATIVE
KETONES UA: NEGATIVE
LEUKOCYTES UA: NEGATIVE
Nitrite, UA: NEGATIVE
PROTEIN UA: NEGATIVE
Spec Grav, UA: 1.01
UROBILINOGEN UA: 0.2
pH, UA: 7

## 2015-08-23 LAB — HEMOGLOBIN A1C
HEMOGLOBIN A1C: 5.1 % (ref <5.7)
MEAN PLASMA GLUCOSE: 100 mg/dL

## 2015-08-23 MED ORDER — LOSARTAN POTASSIUM-HCTZ 100-25 MG PO TABS: ORAL_TABLET | ORAL | Status: DC

## 2015-08-23 NOTE — Progress Notes (Signed)
Subjective:    Patient ID: Harold [name], male    DOB: 1946/01/23, 70 y.o.   MRN: [id number]  HPI 70 year old  White male for health maintenance examAnd evaluation of medical issues. He has a history of peripheral neuropathy, essential hypertension, hyperlipidemia, generalized anxiety disorder, herpes simplex type I, allergic rhinitis. He has some numbness and tingling in his feet that has been present for some time.  Past medical history: Hepatitis A in 1965, history of fractured nose at age 43. Tonsillectomy 1952. Had birthmark removed in the remote past. Septal deviation surgery in February 2004. Cervical disc surgery April 2007.  Colonoscopy in 2014 with tubular adenoma removed from the ascending colon by Dr. Olevia Perches.  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  Social history: He retired is present and of, will pose ringing in [place]. He resides in [place]. He is married. He is a Writer of [employer]. Enjoys golf. One son in good health. Does not smoke. He used to smoke but quit around 1999. Social alcohol consumption.  Family history: Father died at age 93 with an MI with history of diabetes and hypertension. Mother died with complications of Alzheimer's disease. One brother in good health. One sister with history of nephrotic syndrome.     Bilateral carpal tunnel surgery Spring 2017 by Dr. Amedeo Plenty.   Review of Systems  Constitutional: Negative.   All other systems reviewed and are negative.      Objective:   Physical Exam  Constitutional: He is oriented to person, place, and time. He appears well-developed and well-nourished. No distress.  HENT:  Head: Normocephalic and atraumatic.  Right Ear: External ear normal.  Left Ear: External ear normal.  Mouth/Throat: Oropharynx is clear and moist. No oropharyngeal exudate.  Eyes: Conjunctivae and EOM are normal. Pupils are equal, round, and reactive to light. No scleral  icterus.  Neck: Neck supple. No JVD present. No thyromegaly present.  Cardiovascular: Normal rate, regular rhythm, normal heart sounds and intact distal pulses.   No murmur heard. Pulmonary/Chest: Breath sounds normal. No respiratory distress. He has no wheezes. He has no rales.  Abdominal: Soft. Bowel sounds are normal. He exhibits no distension and no mass. There is no tenderness. There is no rebound and no guarding.  Genitourinary: Prostate normal.  Musculoskeletal: He exhibits no edema.  Neurological: He is alert and oriented to person, place, and time. He has normal reflexes. No cranial nerve deficit. Coordination normal.  Skin: Skin is warm and dry. No rash noted. He is not diaphoretic.  Psychiatric: He has a normal mood and affect. His behavior is normal. Judgment and thought content normal.  Vitals reviewed.         Assessment & Plan:  Essential hypertension-stable on current regimen  Peripheral neuropathy-unchanged  Hyperlipidemia-has been on new diet and lipid panel is entirely within normal limits  History of vitamin D deficiency  Allergic rhinitis  Anxiety  History of Herpes simplex type I  Status post bilateral carpal tunnel surgery  Plan: Return in one year or as needed. Continue same medications. Diet seems to be agreeing with him.  Subjective:   Patient presents for Medicare Annual/Subsequent preventive examination.  Review Past Medical/Family/Social:See above   Risk Factors  Current exercise habits: Plays golf Dietary issues discussed: Low fat low carbohydrate  Cardiac risk factors:Family history in father  Depression Screen  (Note: if answer to either of the following is "Yes", a more complete  depression screening is indicated)   Over the past two weeks, have you felt down, depressed or hopeless? No  Over the past two weeks, have you felt little interest or pleasure in doing things? No Have you lost interest or pleasure in daily life? No Do you  often feel hopeless? No Do you cry easily over simple problems? No   Activities of Daily Living  In your present state of health, do you have any difficulty performing the following activities?:   Driving? No  Managing money? No  Feeding yourself? No  Getting from bed to chair? No  Climbing a flight of stairs? No  Preparing food and eating?: No  Bathing or showering? No  Getting dressed: No  Getting to the toilet? No  Using the toilet:No  Moving around from place to place: No  In the past year have you fallen or had a near fall?:No  Are you sexually active? yes Do you have more than one partner? No   Hearing Difficulties: No  Do you often ask people to speak up or repeat themselves? Yes Do you experience ringing or noises in your ears? Yes Do you have difficulty understanding soft or whispered voices? Yes Do you feel that you have a problem with memory? No Do you often misplace items? No    Home Safety:  Do you have a smoke alarm at your residence? Yes Do you have grab bars in the bathroom? Yes Do you have throw rugs in your house? Yes   Cognitive Testing  Alert? Yes Normal Appearance?Yes  Oriented to person? Yes Place? Yes  Time? Yes  Recall of three objects? Yes  Can perform simple calculations? Yes  Displays appropriate judgment?Yes  Can read the correct time from a watch face?Yes   List the Names of Other Physician/Practitioners you currently use:  See referral list for the physicians patient is currently seeing.  Recently saw urologist to get Cialis filled   Review of Systems: See above   Objective:     General appearance: Appears stated age and mildly obese  Head: Normocephalic, without obvious abnormality, atraumatic  Eyes: conj clear, EOMi PEERLA  Ears: normal TM's and external ear canals both ears  Nose: Nares normal. Septum midline. Mucosa normal. No drainage or sinus tenderness.  Throat: lips, mucosa, and tongue normal; teeth and gums normal    Neck: no adenopathy, no carotid bruit, no JVD, supple, symmetrical, trachea midline and thyroid not enlarged, symmetric, no tenderness/mass/nodules  No CVA tenderness.  Lungs: clear to auscultation bilaterally  Breasts: normal appearance, no masses or tenderness,  Heart: regular rate and rhythm, S1, S2 normal, no murmur, click, rub or gallop  Abdomen: soft, non-tender; bowel sounds normal; no masses, no organomegaly  Musculoskeletal: ROM normal in all joints, no crepitus, no deformity, Normal muscle strengthen. Back  is symmetric, no curvature. Skin: Skin color, texture, turgor normal. No rashes or lesions  Lymph nodes: Cervical, supraclavicular, and axillary nodes normal.  Neurologic: CN 2 -12 Normal, Normal symmetric reflexes. Normal coordination and gait  Psych: Alert & Oriented x 3, Mood appear stable.    Assessment:    Annual wellness medicare exam   Plan:    During the course of the visit the patient was educated and counseled about appropriate screening and preventive services including:  Annual PSA  Anal flu vaccine      Patient Instructions (the written plan) was given to the patient.  Medicare Attestation  I have personally reviewed:  The patient's medical  and social history  Their use of alcohol, tobacco or illicit drugs  Their current medications and supplements  The patient's functional ability including ADLs,fall risks, home safety risks, cognitive, and hearing and visual impairment  Diet and physical activities  Evidence for depression or mood disorders  The patient's weight, height, BMI, and visual acuity have been recorded in the chart. I have made referrals, counseling, and provided education to the patient based on review of the above and I have provided the patient with a written personalized care plan for preventive services.

## 2015-08-23 NOTE — Patient Instructions (Signed)
It was a pleasure to see you today. Hemoglobin A1c will be added to your lab work. Continue same medications except decrease metoprolol or even stop it altogether and watch blood pressure. Continue losartan HCTZ. Return in one year or as needed.

## 2015-09-01 DIAGNOSIS — H0014 Chalazion left upper eyelid: Secondary | ICD-10-CM | POA: Diagnosis not present

## 2015-09-15 DIAGNOSIS — H0014 Chalazion left upper eyelid: Secondary | ICD-10-CM | POA: Diagnosis not present

## 2015-10-21 ENCOUNTER — Other Ambulatory Visit: Payer: Self-pay | Admitting: Internal Medicine

## 2015-11-03 DIAGNOSIS — Z23 Encounter for immunization: Secondary | ICD-10-CM | POA: Diagnosis not present

## 2015-12-01 DIAGNOSIS — H9311 Tinnitus, right ear: Secondary | ICD-10-CM | POA: Diagnosis not present

## 2015-12-01 DIAGNOSIS — H903 Sensorineural hearing loss, bilateral: Secondary | ICD-10-CM | POA: Diagnosis not present

## 2015-12-02 ENCOUNTER — Other Ambulatory Visit: Payer: Self-pay | Admitting: Internal Medicine

## 2015-12-20 DIAGNOSIS — H26491 Other secondary cataract, right eye: Secondary | ICD-10-CM | POA: Diagnosis not present

## 2016-01-20 ENCOUNTER — Telehealth: Payer: Self-pay | Admitting: Internal Medicine

## 2016-01-20 NOTE — Telephone Encounter (Signed)
Patient called this morning; states that he will be traveling next month to [place].  Wants to know if he can get vaccinations that he would need.  Advised that we do not keep those here in the office.  They would have to be ordered.  Advised patient that it's best that he come in to see you and discuss this trip and find out all that he needs (he also wanted other Rx's to take on the trip and was speaking of medications that I haven't heard of before).  So, provided appointment for 12/21 at 2:00 p.m.  Patient wants to have the vaccines here and ready to be given to him that day.  Advised that we normally do the consult with you and then we order the vaccines needed and he would come back to have the injections.    The patient was giving me the typical hard time of why can't I just have the vaccines here on the day that he comes for the appointment.  Why do I have to make it so hard?  Why can't I just talk with you and have it ready when he gets here so he doesn't have to come so many times?    So, just wanted you to know that he wants Hep A, Hep B and any other vaccines that you feel he may need traveling to [place].  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

## 2016-01-20 NOTE — Telephone Encounter (Signed)
Please refer pt. To [facility] for these immunizations and travel advice.

## 2016-01-20 NOTE — Telephone Encounter (Signed)
Call patient at [phone]; had to leave a detailed message on patient's voicemail.  Left information to [facility].  Advised that they were located out next to the [facility].  Left their phone # [phone].  Advised patient on the message that Dr. Renold Genta feels that it is best for patient to see them for travel advice regarding vaccination and other medications regarding his travel abroad.  Patient instructed to contact [facility] for all medical advice since we do not keep these vaccines in stock here in the office and we also are not familiar with the other medications that he was asking about.  They should be able to provide him sound advice on his travel regarding malaria, typhoid, yellow fever, etc.

## 2016-01-21 NOTE — Telephone Encounter (Signed)
Cancelled patient's appointment this morning in [facility] per Dr. Verlene Mayer instruction.  Patient will not be seen in the office.  Patient was instructed by Dr. Renold Genta via e-mail to sign up for My Chart and he is to go to [place] for further instructions and vaccine information for his trip and guidance.

## 2016-01-27 ENCOUNTER — Ambulatory Visit: Payer: Medicare Other | Admitting: Internal Medicine

## 2016-02-03 ENCOUNTER — Other Ambulatory Visit: Payer: Self-pay | Admitting: Internal Medicine

## 2016-06-09 ENCOUNTER — Other Ambulatory Visit: Payer: Self-pay | Admitting: Internal Medicine

## 2016-06-09 NOTE — Telephone Encounter (Signed)
Pt due for CPE after July 17. Please call but meds have been refilled

## 2016-06-12 ENCOUNTER — Telehealth: Payer: Self-pay

## 2016-06-12 NOTE — Telephone Encounter (Signed)
ERROR

## 2016-07-19 ENCOUNTER — Other Ambulatory Visit: Payer: Self-pay | Admitting: Internal Medicine

## 2016-09-12 ENCOUNTER — Other Ambulatory Visit: Payer: Medicare Other | Admitting: Internal Medicine

## 2016-09-12 ENCOUNTER — Other Ambulatory Visit: Payer: Self-pay | Admitting: Internal Medicine

## 2016-09-12 DIAGNOSIS — I10 Essential (primary) hypertension: Secondary | ICD-10-CM | POA: Diagnosis not present

## 2016-09-12 DIAGNOSIS — Z125 Encounter for screening for malignant neoplasm of prostate: Secondary | ICD-10-CM | POA: Diagnosis not present

## 2016-09-12 DIAGNOSIS — Z0000 Encounter for general adult medical examination without abnormal findings: Secondary | ICD-10-CM

## 2016-09-12 DIAGNOSIS — E669 Obesity, unspecified: Secondary | ICD-10-CM

## 2016-09-12 LAB — COMPLETE METABOLIC PANEL WITH GFR
ALBUMIN: 4.1 g/dL (ref 3.6–5.1)
ALK PHOS: 60 U/L (ref 40–115)
ALT: 17 U/L (ref 9–46)
AST: 14 U/L (ref 10–35)
BILIRUBIN TOTAL: 0.6 mg/dL (ref 0.2–1.2)
BUN: 21 mg/dL (ref 7–25)
CO2: 26 mmol/L (ref 20–32)
CREATININE: 0.84 mg/dL (ref 0.70–1.18)
Calcium: 9.3 mg/dL (ref 8.6–10.3)
Chloride: 104 mmol/L (ref 98–110)
GFR, EST NON AFRICAN AMERICAN: 89 mL/min (ref >=60)
GFR, Est African American: >89 mL/min (ref >=60)
GLUCOSE: 103 mg/dL — AB (ref 65–99)
Potassium: 4.1 mmol/L (ref 3.5–5.3)
SODIUM: 139 mmol/L (ref 135–146)
TOTAL PROTEIN: 6.2 g/dL (ref 6.1–8.1)

## 2016-09-12 LAB — CBC WITH DIFFERENTIAL/PLATELET
BASOS ABS: 61 {cells}/uL (ref 0–200)
Basophils Relative: 1 %
Eosinophils Absolute: 793 cells/uL — ABNORMAL HIGH (ref 15–500)
Eosinophils Relative: 13 %
HCT: 48.3 % (ref 38.5–50.0)
HEMOGLOBIN: 16.4 g/dL (ref 13.2–17.1)
LYMPHS ABS: 1769 {cells}/uL (ref 850–3900)
Lymphocytes Relative: 29 %
MCH: 32.2 pg (ref 27.0–33.0)
MCHC: 34 g/dL (ref 32.0–36.0)
MCV: 94.7 fL (ref 80.0–100.0)
MONO ABS: 488 {cells}/uL (ref 200–950)
MPV: 9.7 fL (ref 7.5–12.5)
Monocytes Relative: 8 %
NEUTROS ABS: 2989 {cells}/uL (ref 1500–7800)
Neutrophils Relative %: 49 %
Platelets: 228 10*3/uL (ref 140–400)
RBC: 5.1 MIL/uL (ref 4.20–5.80)
RDW: 12.9 % (ref 11.0–15.0)
WBC: 6.1 10*3/uL (ref 3.8–10.8)

## 2016-09-12 LAB — LIPID PANEL
Cholesterol: 149 mg/dL (ref <200)
HDL: 52 mg/dL (ref >40)
LDL CALC: 83 mg/dL (ref <100)
Total CHOL/HDL Ratio: 2.9 Ratio (ref <5.0)
Triglycerides: 72 mg/dL (ref <150)
VLDL: 14 mg/dL (ref <30)

## 2016-09-13 LAB — PSA: PSA: 3.3 ng/mL (ref <=4.0)

## 2016-09-15 ENCOUNTER — Ambulatory Visit (INDEPENDENT_AMBULATORY_CARE_PROVIDER_SITE_OTHER): Payer: Medicare Other | Admitting: Internal Medicine

## 2016-09-15 ENCOUNTER — Encounter: Payer: Self-pay | Admitting: Internal Medicine

## 2016-09-15 VITALS — BP 104/62 | HR 70 | Temp 97.1°F | Ht 73.25 in | Wt 238.0 lb

## 2016-09-15 DIAGNOSIS — J309 Allergic rhinitis, unspecified: Secondary | ICD-10-CM

## 2016-09-15 DIAGNOSIS — E784 Other hyperlipidemia: Secondary | ICD-10-CM

## 2016-09-15 DIAGNOSIS — I10 Essential (primary) hypertension: Secondary | ICD-10-CM | POA: Diagnosis not present

## 2016-09-15 DIAGNOSIS — G609 Hereditary and idiopathic neuropathy, unspecified: Secondary | ICD-10-CM

## 2016-09-15 DIAGNOSIS — Z0000 Encounter for general adult medical examination without abnormal findings: Secondary | ICD-10-CM | POA: Diagnosis not present

## 2016-09-15 DIAGNOSIS — N529 Male erectile dysfunction, unspecified: Secondary | ICD-10-CM

## 2016-09-15 DIAGNOSIS — F411 Generalized anxiety disorder: Secondary | ICD-10-CM

## 2016-09-15 DIAGNOSIS — E7849 Other hyperlipidemia: Secondary | ICD-10-CM

## 2016-09-15 LAB — POCT URINALYSIS DIPSTICK
Bilirubin, UA: NEGATIVE
Blood, UA: NEGATIVE
GLUCOSE UA: NEGATIVE
Ketones, UA: NEGATIVE
Leukocytes, UA: NEGATIVE
NITRITE UA: NEGATIVE
PROTEIN UA: NEGATIVE
UROBILINOGEN UA: 0.2 U/dL
pH, UA: 5 (ref 5.0–8.0)

## 2016-09-15 MED ORDER — METHYLPREDNISOLONE ACETATE 80 MG/ML IJ SUSP
80.0000 mg | Freq: Once | INTRAMUSCULAR | Status: AC
  Administered 2016-09-15: 80 mg via INTRAMUSCULAR

## 2016-09-15 NOTE — Progress Notes (Signed)
Subjective:    Patient ID: Harold [name], male    DOB: Feb 16, 1945, 71 y.o.   MRN: [id number]  HPI 71 year old Male in today for Medicare wellness visit and health maintenance exam as well as evaluation of medical problems.  Recent issues with postnasal drip and mucus and drainage. Ears feel stopped up.  Long-standing history of numbness in feet for which he has used some Neurontin cream without a lot of relief. B-12 level checked.  Having some left iliotibial band issues. May want to go to physical therapy. His been trying to do some stretching exercises.  History of hypertension, hyperlipidemia, generalized anxiety disorder, Herpes simplex type I.  Past medical history: Hepatitis A in 1965, history of fractured nose at age 36. Tonsillectomy 1952. Had birthmark removed in the remote past. Septal deviation surgery in February 2004. Cervical disc surgery April 2007.  He has a healthcare power of attorney and has provided [place] with those documents.  Colonoscopy in 2014 with tubular adenoma removed from the ascending colon by Dr. Olevia Perches.  Social history: He is retired and formerly operated a Tour manager. He resides in [place]. He is married. Graduate of [employer]. Enjoys golf. Does not smoke. Son deceased about 6 months ago. He used to smoke but quit around 1999. Social alcohol consumption.  Family history: Father died at age 15 with MI with history of diabetes and hypertension. Mother died with complications of Alzheimer's disease. One brother in good health. One sister with history of nephrotic syndrome.  Bilateral carpal tunnel surgery spring 2017 by Dr. Amedeo Plenty.    Review of Systems  Constitutional: Negative.   Cardiovascular: Negative.   Gastrointestinal: Negative.   Endocrine: Negative.   Genitourinary: Negative.   Psychiatric/Behavioral:       Appropriate grief reaction to loss of son.       Objective:   Physical Exam  Constitutional: He is  oriented to person, place, and time. He appears well-developed and well-nourished. No distress.  HENT:  Head: Normocephalic and atraumatic.  Right Ear: External ear normal.  Left Ear: External ear normal.  Mouth/Throat: Oropharynx is clear and moist.  Eyes: Pupils are equal, round, and reactive to light. Conjunctivae and EOM are normal. Right eye exhibits no discharge. Left eye exhibits no discharge. No scleral icterus.  Neck: Neck supple. No JVD present. No thyromegaly present.  Cardiovascular: Normal rate, regular rhythm, normal heart sounds and intact distal pulses.   No murmur heard. Pulmonary/Chest: Effort normal and breath sounds normal. No respiratory distress. He has no wheezes.  Abdominal: Soft. Bowel sounds are normal. He exhibits no distension and no mass. There is no tenderness. There is no rebound and no guarding.  Genitourinary: Prostate normal.  Musculoskeletal: He exhibits no edema.  Lymphadenopathy:    He has no cervical adenopathy.  Neurological: He is alert and oriented to person, place, and time. He has normal reflexes. No cranial nerve deficit. Coordination normal.  Skin: Skin is warm and dry. No rash noted. He is not diaphoretic.  Psychiatric: He has a normal mood and affect. His behavior is normal. Judgment and thought content normal.  Vitals reviewed.         Assessment & Plan:  Essential hypertension-stable on current regimen  Hyperlipidemia-lipid panel liver functions are normal  Grief reaction-appropriate with loss of son  Left iliotibial band pain-consider physical therapy  Peripheral neuropathy in feet which is long-standing. Check B-12 level once again  Allergic rhinitis-Depo-Medrol 80 mg IM given.  Plan:  Continue same medications and return in one year or as needed.  Subjective:   Patient presents for Medicare Annual/Subsequent preventive examination.  Review Past Medical/Family/Social:See above   Risk Factors  Current exercise habits:  Plays golf Dietary issues discussed: Low fat low carbohydrate  Cardiac risk factors: Hypertension, hyperlipidemia, family history in father  Depression Screen  (Note: if answer to either of the following is "Yes", a more complete depression screening is indicated)   Over the past two weeks, have you felt down, depressed or hopeless? No  Over the past two weeks, have you felt little interest or pleasure in doing things? No Have you lost interest or pleasure in daily life? No Do you often feel hopeless? No Do you cry easily over simple problems? No   Activities of Daily Living  In your present state of health, do you have any difficulty performing the following activities?:   Driving? No  Managing money? No  Feeding yourself? No  Getting from bed to chair? No  Climbing a flight of stairs? No  Preparing food and eating?: No  Bathing or showering? No  Getting dressed: No  Getting to the toilet? No  Using the toilet:No  Moving around from place to place: No  In the past year have you fallen or had a near fall?:No  Are you sexually active? yes Do you have more than one partner? No   Hearing Difficulties: No  Do you often ask people to speak up or repeat themselves? Yes sometimes Do you experience ringing or noises in your ears? Yes Do you have difficulty understanding soft or whispered voices? No  Do you feel that you have a problem with memory? No Do you often misplace items? No    Home Safety:  Do you have a smoke alarm at your residence? Yes  Do you have grab bars in the bathroom?Yes Do you have throw rugs in your house? Yes   Cognitive Testing  Alert? Yes Normal Appearance?Yes  Oriented to person? Yes Place? Yes  Time? Yes  Recall of three objects? Yes  Can perform simple calculations? Yes  Displays appropriate judgment?Yes  Can read the correct time from a watch face?Yes   List the Names of Other Physician/Practitioners you currently use:  See referral list for  the physicians patient is currently seeing.     Review of Systems: See above   Objective:     General appearance: Appears stated age and mildly obese  Head: Normocephalic, without obvious abnormality, atraumatic  Eyes: conj clear, EOMi PEERLA  Ears: normal TM's and external ear canals both ears  Nose: Nares normal. Septum midline. Mucosa normal. No drainage or sinus tenderness.  Throat: lips, mucosa, and tongue normal; teeth and gums normal  Neck: no adenopathy, no carotid bruit, no JVD, supple, symmetrical, trachea midline and thyroid not enlarged, symmetric, no tenderness/mass/nodules  No CVA tenderness.  Lungs: clear to auscultation bilaterally  Breasts: normal appearance, no masses or tenderness Heart: regular rate and rhythm, S1, S2 normal, no murmur, click, rub or gallop  Abdomen: soft, non-tender; bowel sounds normal; no masses, no organomegaly  Musculoskeletal: ROM normal in all joints, no crepitus, no deformity, Normal muscle strengthen. Back  is symmetric, no curvature. Skin: Skin color, texture, turgor normal. No rashes or lesions  Lymph nodes: Cervical, supraclavicular, and axillary nodes normal.  Neurologic: CN 2 -12 Normal, Normal symmetric reflexes. Normal coordination and gait  Psych: Alert & Oriented x 3, Mood appear stable.    Assessment:  Annual wellness medicare exam   Plan:    During the course of the visit the patient was educated and counseled about appropriate screening and preventive services including:   Recommend annual flu vaccine  PSA is normal     Patient Instructions (the written plan) was given to the patient.  Medicare Attestation  I have personally reviewed:  The patient's medical and social history  Their use of alcohol, tobacco or illicit drugs  Their current medications and supplements  The patient's functional ability including ADLs,fall risks, home safety risks, cognitive, and hearing and visual impairment  Diet and physical  activities  Evidence for depression or mood disorders  The patient's weight, height, BMI, and visual acuity have been recorded in the chart. I have made referrals, counseling, and provided education to the patient based on review of the above and I have provided the patient with a written personalized care plan for preventive services.

## 2016-09-16 LAB — VITAMIN B12: Vitamin B-12: 300 pg/mL (ref 200–1100)

## 2016-09-21 ENCOUNTER — Telehealth: Payer: Self-pay

## 2016-09-21 NOTE — Telephone Encounter (Signed)
Printed letter

## 2016-09-21 NOTE — Telephone Encounter (Signed)
-----   Message from Elby Showers, MD sent at 09/18/2016  9:09 AM EDT ----- B12 level is OK but Could take oral B12 supplement daily over the counter

## 2016-10-04 NOTE — Patient Instructions (Addendum)
Please take care of yourself. Continue same medications and return in one year or as needed.

## 2016-10-12 ENCOUNTER — Other Ambulatory Visit: Payer: Self-pay | Admitting: Internal Medicine

## 2016-10-16 DIAGNOSIS — N401 Enlarged prostate with lower urinary tract symptoms: Secondary | ICD-10-CM | POA: Diagnosis not present

## 2016-10-16 DIAGNOSIS — Z79899 Other long term (current) drug therapy: Secondary | ICD-10-CM | POA: Diagnosis not present

## 2016-10-16 DIAGNOSIS — N138 Other obstructive and reflux uropathy: Secondary | ICD-10-CM | POA: Diagnosis not present

## 2016-12-22 ENCOUNTER — Other Ambulatory Visit: Payer: Self-pay | Admitting: Internal Medicine

## 2016-12-22 DIAGNOSIS — Z23 Encounter for immunization: Secondary | ICD-10-CM | POA: Diagnosis not present

## 2017-07-21 ENCOUNTER — Other Ambulatory Visit: Payer: Self-pay | Admitting: Internal Medicine

## 2017-07-26 ENCOUNTER — Encounter: Payer: Self-pay | Admitting: Gastroenterology

## 2017-10-22 DIAGNOSIS — H26492 Other secondary cataract, left eye: Secondary | ICD-10-CM | POA: Diagnosis not present

## 2017-10-25 DIAGNOSIS — A630 Anogenital (venereal) warts: Secondary | ICD-10-CM | POA: Diagnosis not present

## 2017-10-25 DIAGNOSIS — N529 Male erectile dysfunction, unspecified: Secondary | ICD-10-CM | POA: Diagnosis not present

## 2017-10-25 DIAGNOSIS — N138 Other obstructive and reflux uropathy: Secondary | ICD-10-CM | POA: Diagnosis not present

## 2017-10-25 DIAGNOSIS — N401 Enlarged prostate with lower urinary tract symptoms: Secondary | ICD-10-CM | POA: Diagnosis not present

## 2017-10-29 DIAGNOSIS — Z23 Encounter for immunization: Secondary | ICD-10-CM | POA: Diagnosis not present

## 2017-11-02 ENCOUNTER — Other Ambulatory Visit: Payer: Medicare Other | Admitting: Internal Medicine

## 2017-11-05 ENCOUNTER — Encounter: Payer: Medicare Other | Admitting: Internal Medicine

## 2017-11-15 ENCOUNTER — Other Ambulatory Visit: Payer: Self-pay | Admitting: Internal Medicine

## 2017-11-15 DIAGNOSIS — Z125 Encounter for screening for malignant neoplasm of prostate: Secondary | ICD-10-CM

## 2017-11-15 DIAGNOSIS — G609 Hereditary and idiopathic neuropathy, unspecified: Secondary | ICD-10-CM

## 2017-11-15 DIAGNOSIS — I10 Essential (primary) hypertension: Secondary | ICD-10-CM

## 2017-11-15 DIAGNOSIS — Z0000 Encounter for general adult medical examination without abnormal findings: Secondary | ICD-10-CM

## 2017-11-15 DIAGNOSIS — G5603 Carpal tunnel syndrome, bilateral upper limbs: Secondary | ICD-10-CM

## 2017-11-15 DIAGNOSIS — F411 Generalized anxiety disorder: Secondary | ICD-10-CM

## 2017-11-15 DIAGNOSIS — N529 Male erectile dysfunction, unspecified: Secondary | ICD-10-CM

## 2017-11-20 ENCOUNTER — Other Ambulatory Visit: Payer: Self-pay | Admitting: Dermatology

## 2017-11-20 ENCOUNTER — Other Ambulatory Visit: Payer: Medicare Other | Admitting: Internal Medicine

## 2017-11-20 DIAGNOSIS — Z0000 Encounter for general adult medical examination without abnormal findings: Secondary | ICD-10-CM | POA: Diagnosis not present

## 2017-11-20 DIAGNOSIS — C44212 Basal cell carcinoma of skin of right ear and external auricular canal: Secondary | ICD-10-CM | POA: Diagnosis not present

## 2017-11-20 DIAGNOSIS — F411 Generalized anxiety disorder: Secondary | ICD-10-CM

## 2017-11-20 DIAGNOSIS — C4491 Basal cell carcinoma of skin, unspecified: Secondary | ICD-10-CM

## 2017-11-20 DIAGNOSIS — G5603 Carpal tunnel syndrome, bilateral upper limbs: Secondary | ICD-10-CM

## 2017-11-20 DIAGNOSIS — N529 Male erectile dysfunction, unspecified: Secondary | ICD-10-CM | POA: Diagnosis not present

## 2017-11-20 DIAGNOSIS — L570 Actinic keratosis: Secondary | ICD-10-CM | POA: Diagnosis not present

## 2017-11-20 DIAGNOSIS — D229 Melanocytic nevi, unspecified: Secondary | ICD-10-CM | POA: Diagnosis not present

## 2017-11-20 DIAGNOSIS — G609 Hereditary and idiopathic neuropathy, unspecified: Secondary | ICD-10-CM | POA: Diagnosis not present

## 2017-11-20 DIAGNOSIS — I10 Essential (primary) hypertension: Secondary | ICD-10-CM

## 2017-11-20 DIAGNOSIS — Z125 Encounter for screening for malignant neoplasm of prostate: Secondary | ICD-10-CM | POA: Diagnosis not present

## 2017-11-20 DIAGNOSIS — L920 Granuloma annulare: Secondary | ICD-10-CM | POA: Diagnosis not present

## 2017-11-20 HISTORY — DX: Basal cell carcinoma of skin, unspecified: C44.91

## 2017-11-21 LAB — LIPID PANEL
Cholesterol: 182 mg/dL (ref <200)
HDL: 42 mg/dL (ref >40)
LDL Cholesterol (Calc): 117 mg/dL (calc) — ABNORMAL HIGH
NON-HDL CHOLESTEROL (CALC): 140 mg/dL — AB (ref <130)
TRIGLYCERIDES: 121 mg/dL (ref <150)
Total CHOL/HDL Ratio: 4.3 (calc) (ref <5.0)

## 2017-11-21 LAB — COMPLETE METABOLIC PANEL WITH GFR
AG RATIO: 1.9 (calc) (ref 1.0–2.5)
ALT: 22 U/L (ref 9–46)
AST: 19 U/L (ref 10–35)
Albumin: 4.4 g/dL (ref 3.6–5.1)
Alkaline phosphatase (APISO): 57 U/L (ref 40–115)
BILIRUBIN TOTAL: 0.7 mg/dL (ref 0.2–1.2)
BUN: 13 mg/dL (ref 7–25)
CHLORIDE: 102 mmol/L (ref 98–110)
CO2: 26 mmol/L (ref 20–32)
Calcium: 9.6 mg/dL (ref 8.6–10.3)
Creat: 0.76 mg/dL (ref 0.70–1.18)
GFR, Est African American: 106 mL/min/{1.73_m2} (ref >=60)
GFR, Est Non African American: 92 mL/min/{1.73_m2} (ref >=60)
Globulin: 2.3 g/dL (calc) (ref 1.9–3.7)
Glucose, Bld: 95 mg/dL (ref 65–99)
POTASSIUM: 4.2 mmol/L (ref 3.5–5.3)
Sodium: 136 mmol/L (ref 135–146)
Total Protein: 6.7 g/dL (ref 6.1–8.1)

## 2017-11-21 LAB — CBC WITH DIFFERENTIAL/PLATELET
BASOS ABS: 48 {cells}/uL (ref 0–200)
Basophils Relative: 0.7 %
EOS ABS: 138 {cells}/uL (ref 15–500)
Eosinophils Relative: 2 %
HCT: 48.3 % (ref 38.5–50.0)
Hemoglobin: 17 g/dL (ref 13.2–17.1)
Lymphs Abs: 2056 cells/uL (ref 850–3900)
MCH: 32.3 pg (ref 27.0–33.0)
MCHC: 35.2 g/dL (ref 32.0–36.0)
MCV: 91.8 fL (ref 80.0–100.0)
MONOS PCT: 9.6 %
MPV: 9.9 fL (ref 7.5–12.5)
Neutro Abs: 3995 cells/uL (ref 1500–7800)
Neutrophils Relative %: 57.9 %
Platelets: 253 10*3/uL (ref 140–400)
RBC: 5.26 10*6/uL (ref 4.20–5.80)
RDW: 11.9 % (ref 11.0–15.0)
TOTAL LYMPHOCYTE: 29.8 %
WBC mixed population: 662 cells/uL (ref 200–950)
WBC: 6.9 10*3/uL (ref 3.8–10.8)

## 2017-11-21 LAB — PSA: PSA: 3.9 ng/mL (ref <=4.0)

## 2017-11-22 ENCOUNTER — Encounter: Payer: Self-pay | Admitting: Internal Medicine

## 2017-11-22 ENCOUNTER — Ambulatory Visit (INDEPENDENT_AMBULATORY_CARE_PROVIDER_SITE_OTHER): Payer: Medicare Other | Admitting: Internal Medicine

## 2017-11-22 VITALS — BP 130/60 | HR 79 | Ht 74.0 in | Wt 258.0 lb

## 2017-11-22 DIAGNOSIS — I10 Essential (primary) hypertension: Secondary | ICD-10-CM | POA: Diagnosis not present

## 2017-11-22 DIAGNOSIS — N529 Male erectile dysfunction, unspecified: Secondary | ICD-10-CM | POA: Diagnosis not present

## 2017-11-22 DIAGNOSIS — Z0000 Encounter for general adult medical examination without abnormal findings: Secondary | ICD-10-CM | POA: Diagnosis not present

## 2017-11-22 DIAGNOSIS — B009 Herpesviral infection, unspecified: Secondary | ICD-10-CM

## 2017-11-22 DIAGNOSIS — N400 Enlarged prostate without lower urinary tract symptoms: Secondary | ICD-10-CM

## 2017-11-22 DIAGNOSIS — F411 Generalized anxiety disorder: Secondary | ICD-10-CM | POA: Diagnosis not present

## 2017-11-22 DIAGNOSIS — G609 Hereditary and idiopathic neuropathy, unspecified: Secondary | ICD-10-CM | POA: Diagnosis not present

## 2017-11-22 LAB — POCT URINALYSIS DIPSTICK
APPEARANCE: NORMAL
Bilirubin, UA: NEGATIVE
Blood, UA: NEGATIVE
Glucose, UA: NEGATIVE
KETONES UA: NEGATIVE
LEUKOCYTES UA: NEGATIVE
NITRITE UA: NEGATIVE
ODOR: NORMAL
PH UA: 6 (ref 5.0–8.0)
PROTEIN UA: NEGATIVE
Spec Grav, UA: 1.015 (ref 1.010–1.025)
UROBILINOGEN UA: 0.2 U/dL

## 2017-12-05 ENCOUNTER — Ambulatory Visit (INDEPENDENT_AMBULATORY_CARE_PROVIDER_SITE_OTHER): Payer: Medicare Other | Admitting: Orthopaedic Surgery

## 2017-12-05 ENCOUNTER — Ambulatory Visit (INDEPENDENT_AMBULATORY_CARE_PROVIDER_SITE_OTHER): Payer: Medicare Other

## 2017-12-05 DIAGNOSIS — G8929 Other chronic pain: Secondary | ICD-10-CM

## 2017-12-05 DIAGNOSIS — M25562 Pain in left knee: Secondary | ICD-10-CM

## 2017-12-05 DIAGNOSIS — N400 Enlarged prostate without lower urinary tract symptoms: Secondary | ICD-10-CM | POA: Insufficient documentation

## 2017-12-05 MED ORDER — METHYLPREDNISOLONE ACETATE 40 MG/ML IJ SUSP
40.0000 mg | INTRAMUSCULAR | Status: AC | PRN
  Administered 2017-12-05: 40 mg via INTRA_ARTICULAR

## 2017-12-05 MED ORDER — LIDOCAINE HCL 1 % IJ SOLN
3.0000 mL | INTRAMUSCULAR | Status: AC | PRN
  Administered 2017-12-05: 3 mL

## 2017-12-05 NOTE — Progress Notes (Signed)
Office Visit Note   Patient: Harold [name].           Date of Birth: 29-Oct-1945           MRN: [id number] Visit Date: 12/05/2017              Requested by: Elby Showers, MD [address] [place], [place] [address] PCP: Elby Showers, MD   Assessment & Plan: Visit Diagnoses:  1. Chronic pain of left knee     Plan: We did decide to place a steroid injection in his left knee.  He is going work on Astronomer.  He understands the risk and benefits of injections and tolerated it well.  Follow-up will be as needed.  However if he develops locking catching and continued pain after a week to 2 weeks he will call because I would order an MRI of his left knee to rule out a meniscal tear based on clinical exam findings and signs and symptoms.  Given the fact that his plain films showed no significant arthritic changes he most likely has something going on meniscus.  Again he understands to call us if this does not improve.  Follow-Up Instructions: Return if symptoms worsen or fail to improve.   Orders:  Orders Placed This Encounter  Procedures  . Large Joint Inj  . XR Knee 1-2 Views Left   No orders of the defined types were placed in this encounter.     Procedures: Large Joint Inj: L knee on 12/05/2017 9:45 AM Indications: diagnostic evaluation and pain Details: 22 G 1.5 in needle, superolateral approach  Arthrogram: No  Medications: 3 mL lidocaine 1 %; 40 mg methylPREDNISolone acetate 40 MG/ML Outcome: tolerated well, no immediate complications Procedure, treatment alternatives, risks and benefits explained, specific risks discussed. Consent was given by the patient. Immediately prior to procedure a time out was called to verify the correct patient, procedure, equipment, support staff and site/side marked as required. Patient was prepped and draped in the usual sterile fashion.       Clinical Data: No additional findings.   Subjective: Chief  Complaint  Patient presents with  . Left Knee - Pain    HPI 72 year old male with left knee pain this been bothering for some time.  He states after golf lesson that he had increased pain lateral aspect of the knee.  He has had no mechanical symptoms.  He does awaken him at night sometimes.  Feels that he is getting weaker despite him to try to walk for exercise of riding a stationary bike.  He has some discomfort with prolonged sitting.  Also whenever he gets up from out of a chair he has some discomfort in the left knee.  History of right knee arthroscopy with meniscectomy and microfracture.  Right knee at this point time is not particularly bothersome.  Does take ibuprofen and Osteo Bi-Flex feels that this does help some with his knees.  Review of Systems See HPI otherwise negative  Objective: Vital Signs: There were no vitals taken for this visit.  Physical Exam  Constitutional: He is oriented to person, place, and time. He appears well-developed and well-nourished. No distress.  Pulmonary/Chest: Effort normal.  Neurological: He is alert and oriented to person, place, and time.  Skin: He is not diaphoretic.  Psychiatric: He has a normal mood and affect.    Ortho Exam Bilateral knees full range of motion.  Significant patellofemoral crepitus bilaterally.  Lamount Cranker is negative  bilaterally.  No instability valgus varus stressing of either knee.  Nontender throughout both knees.  No effusion abnormal warmth erythema.  Anterior drawer is negative bilaterally.  He has pain with McMurray's maneuver on the left only. Specialty Comments:  No specialty comments available.  Imaging: Xr Knee 1-2 Views Left  Result Date: 12/05/2017 Left knee 2 views: Medial lateral joint line well-maintained minimal arthritic changes.  Mild patellofemoral arthritic changes.  No acute fractures.  Knee is well located.  Right knee on the AP view does show near bone-on-bone.    PMFS History: Patient  Active Problem List   Diagnosis Date Noted  . Hereditary and idiopathic peripheral neuropathy 09/06/2014  . Bilateral carpal tunnel syndrome 09/06/2014  . Obesity, unspecified 08/18/2012  . Erectile dysfunction 08/18/2012  . Allergic rhinitis 08/18/2012  . Hypertension 12/21/2010  . Anxiety 12/21/2010  . Herpes simplex type 1 infection 12/21/2010   Past Medical History:  Diagnosis Date  . Allergy   . Dependent edema   . Granuloma annulare   . Hyperlipidemia   . Hypertension   . Vitamin D deficiency     Family History  Problem Relation Age of Onset  . Heart disease Father   . Diabetes Father   . Hypertension Father   . Kidney disease Sister   . Colon cancer Neg Hx     Past Surgical History:  Procedure Laterality Date  . Birthmark removed  1948   at [facility]  . [place] SURGERY  2004  . COLONOSCOPY  06-03-2002   "scope not long enough" per pt.  Marland Kitchen KNEE SURGERY  2008  . NASAL SEPTUM SURGERY    . SHOULDER ARTHROSCOPY  04/29/09   subacromial decompression, labral debridement, rotator cuff debridement, open reconstruction of complex rotator interval/supraspinatus rotator cuff tear  . TONSILLECTOMY     Social History   Occupational History  . Not on file  Tobacco Use  . Smoking status: Former Smoker    Packs/day: 0.25    Types: Cigarettes    Last attempt to quit: 02/06/1997    Years since quitting: 20.8  . Smokeless tobacco: Never Used  Substance and Sexual Activity  . Alcohol use: Yes    Alcohol/week: 7.0 standard drinks    Types: 7 Glasses of wine per week  . Drug use: No  . Sexual activity: Yes

## 2017-12-05 NOTE — Patient Instructions (Signed)
It was a pleasure to see you today.  Continue same medications and follow-up in 1 year. 

## 2017-12-05 NOTE — Progress Notes (Signed)
Subjective:    Patient ID: Harold [name]., male    DOB: 1945/08/14, 72 y.o.   MRN: [id number]  HPI 72 year old Male for Medicare wellness, health maintenance exam and evaluation of medical issues.  Having some left knee pain and will see orthopedist soon.  Saw urologist, Dr. Estill Dooms, in September in [place].  PSA was not done.  We were asked to perform his PSA.  Apparently has history of condyloma treated with cryotherapy by urologist.  History of BPH.  History of erectile dysfunction uses Cialis.  He takes Valtrex daily by mouth for HSV type I infection.  Prostate exam by urologist was normal without nodules.  Long-standing history of numbness in feet for which she has used Neurontin cream without a lot of relief.  B12 level has been checked in the past.  History of hypertension, hyperlipidemia, anxiety.  Past medical history: Hepatitis A in 05-06-1963, fractured nose at age 59, tonsillectomy 47.  Nasal septum deviation surgery February 2004.  Cervical disc surgery April 2007.  He is a healthcare power of attorney and his provided [place] with those documents.  Colonoscopy 05-05-12 by Dr. Maurene Capes with tubular adenoma being removed from the ascending colon.  Follow-up due soon.  Social history: He is retired and formerly operated a Tour manager.  He resides in [place].  He is married.  Graduate of [employer].  Enjoys golf.  Does not smoke.  Son deceased in 05/05/16.  He used to smoke but quit around 1997/05/05.  Social alcohol consumption.  Bilateral carpal tunnel surgery in the spring 2017 by Dr. Amedeo Plenty.  Family history: Father died at age 53 with MI with history of diabetes and hypertension.  Mother died with complications of Alzheimer's disease.  One brother in good health.  One sister with history of nephrotic syndrome.      Review of Systems  Constitutional: Negative.   Respiratory: Negative.   Cardiovascular: Negative.   Gastrointestinal: Negative.     Psychiatric/Behavioral: Negative.        Objective:   Physical Exam  Constitutional: He is oriented to person, place, and time. He appears well-developed and well-nourished. No distress.  HENT:  Head: Normocephalic and atraumatic.  Right Ear: External ear normal.  Left Ear: External ear normal.  Mouth/Throat: Oropharynx is clear and moist. No oropharyngeal exudate.  Eyes: Pupils are equal, round, and reactive to light. Conjunctivae are normal. Right eye exhibits no discharge. Left eye exhibits no discharge.  Neck: Neck supple. No JVD present. No thyromegaly present.  Cardiovascular: Normal rate, regular rhythm, normal heart sounds and intact distal pulses.  No murmur heard. Pulmonary/Chest: Effort normal and breath sounds normal. No stridor. No respiratory distress. He has no wheezes. He has no rales.  Abdominal: He exhibits no distension and no mass. There is no tenderness. There is no rebound and no guarding. No hernia.  Genitourinary:  Genitourinary Comments: Prostate exam deferred to urologist  Musculoskeletal: He exhibits no edema.  Lymphadenopathy:    He has no cervical adenopathy.  Neurological: He is alert and oriented to person, place, and time. He displays normal reflexes. No cranial nerve deficit. He exhibits normal muscle tone. Coordination normal.  Decreased sensation in feet long-standing  Skin: Skin is warm and dry. No rash noted. He is not diaphoretic.  Psychiatric: He has a normal mood and affect. His behavior is normal. Judgment and thought content normal.  Vitals reviewed.         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  B12 deficiency has been ruled out  Anxiety-stable  Allergic rhinitis treated with Astelin nasal spray  HSV type I treated with daily valacyclovir  Subjective:   Patient presents for Medicare Annual/Subsequent preventive examination.  Review Past Medical/Family/Social: See above   Risk Factors  Current exercise habits: Plays golf Dietary issues discussed: Low-fat low carbohydrate  Cardiac risk factors: Essential hypertension, family history and father  Depression Screen  (Note: if answer to either of the following is "Yes", a more complete depression screening is indicated)   Over the past two weeks, have you felt down, depressed or hopeless? No  Over the past two weeks, have you felt little interest or pleasure in doing things? No Have you lost interest or pleasure in daily life? No Do you often feel hopeless? No Do you cry easily over simple problems? No   Activities of Daily Living  In your present state of health, do you have any difficulty performing the following activities?:   Driving? No  Managing money? No  Feeding yourself? No  Getting from bed to chair? No  Climbing a flight of stairs? No  Preparing food and eating?: No  Bathing or showering? No  Getting dressed: No  Getting to the toilet? No  Using the toilet:No  Moving around from place to place: No  In the past year have you fallen or had a near fall?:No  Are you sexually active?  Yes Do you have more than one partner? No   Hearing Difficulties:  Do you often ask people to speak up or repeat themselves?  Yes Do you experience ringing or noises in your ears? No  Do you have difficulty understanding soft or whispered voices?  Yes Do you feel that you have a problem with memory? No Do you often misplace items? No    Home Safety:  Do you have a smoke alarm at your residence? Yes Do you have grab bars in the bathroom?  Yes Do you have throw rugs in your house?  Yes   Cognitive  Testing  Alert? Yes Normal Appearance?Yes  Oriented to person? Yes Place? Yes  Time? Yes  Recall of three objects? Yes  Can perform simple calculations? Yes  Displays appropriate judgment?Yes  Can read the correct time from a watch face?Yes   List the Names of Other Physician/Practitioners you currently use:  See referral list for the physicians patient is currently seeing.  Orthopedist   Review of Systems: See above  Objective:     General appearance: Appears stated age and mildly obese  Head: Normocephalic, without obvious abnormality, atraumatic  Eyes: conj clear, EOMi PEERLA  Ears: normal TM's and external ear canals both ears  Nose: Nares normal. Septum midline. Mucosa normal. No drainage or sinus tenderness.  Throat: lips, mucosa, and tongue normal; teeth and gums normal  Neck: no adenopathy, no carotid bruit, no JVD, supple, symmetrical, trachea midline and thyroid not enlarged, symmetric, no tenderness/mass/nodules  No CVA tenderness.  Lungs: clear to auscultation bilaterally  Breasts: normal appearance Heart: regular rate and rhythm, S1, S2 normal, no murmur, click, rub or gallop  Abdomen: soft, non-tender; bowel  sounds normal; no masses, no organomegaly  Musculoskeletal: ROM normal in all joints, no crepitus, no deformity, Normal muscle strengthen. Back  is symmetric, no curvature. Skin: Skin color, texture, turgor normal. No rashes or lesions  Lymph nodes: Cervical, supraclavicular, and axillary nodes normal.  Neurologic: CN 2 -12 Normal, Normal symmetric reflexes. Normal coordination and gait  Psych: Alert & Oriented x 3, Mood appear stable.    Assessment:    Annual wellness medicare exam   Plan:    During the course of the visit the patient was educated and counseled about appropriate screening and preventive services including:   Recommend annual flu vaccine     Patient Instructions (the written plan) was given to the patient.  Medicare Attestation    I have personally reviewed:  The patient's medical and social history  Their use of alcohol, tobacco or illicit drugs  Their current medications and supplements  The patient's functional ability including ADLs,fall risks, home safety risks, cognitive, and hearing and visual impairment  Diet and physical activities  Evidence for depression or mood disorders  The patient's weight, height, BMI, and visual acuity have been recorded in the chart. I have made referrals, counseling, and provided education to the patient based on review of the above and I have provided the patient with a written personalized care plan for preventive services.

## 2018-01-02 ENCOUNTER — Telehealth: Payer: Self-pay | Admitting: Internal Medicine

## 2018-01-02 MED ORDER — LOSARTAN POTASSIUM 100 MG PO TABS: 100.0000 mg | ORAL_TABLET | Freq: Every day | ORAL | 3 refills | Status: DC

## 2018-01-02 MED ORDER — HYDROCHLOROTHIAZIDE 25 MG PO TABS: 25.0000 mg | ORAL_TABLET | Freq: Every day | ORAL | 0 refills | Status: DC

## 2018-01-02 NOTE — Telephone Encounter (Signed)
Pharmacy requests change from Losartan HCTZ to separate prescriptions due to back order This was done today. 100/25

## 2018-02-27 DIAGNOSIS — C44212 Basal cell carcinoma of skin of right ear and external auricular canal: Secondary | ICD-10-CM | POA: Diagnosis not present

## 2018-04-05 ENCOUNTER — Other Ambulatory Visit: Payer: Self-pay | Admitting: Internal Medicine

## 2018-04-16 DIAGNOSIS — B300 Keratoconjunctivitis due to adenovirus: Secondary | ICD-10-CM | POA: Diagnosis not present

## 2018-04-19 DIAGNOSIS — B300 Keratoconjunctivitis due to adenovirus: Secondary | ICD-10-CM | POA: Diagnosis not present

## 2018-04-22 ENCOUNTER — Encounter: Payer: Self-pay | Admitting: Internal Medicine

## 2018-06-12 DIAGNOSIS — L570 Actinic keratosis: Secondary | ICD-10-CM | POA: Diagnosis not present

## 2018-06-12 DIAGNOSIS — L300 Nummular dermatitis: Secondary | ICD-10-CM | POA: Diagnosis not present

## 2018-07-09 ENCOUNTER — Telehealth: Payer: Self-pay | Admitting: Internal Medicine

## 2018-07-09 NOTE — Telephone Encounter (Signed)
He can try calling Neurosurgery office here in [place] and tell them he saw Dr. Carloyn Manner in 2007 for cervical spine issues and had surgery. Their office number is [phone]

## 2018-07-09 NOTE — Telephone Encounter (Signed)
Pt called and is looking for a recommendation for spinal stinosis, pt said he saw Dr Glenna Fellows before but when he called it said he was retired, we he walks about a quarter mile his hips start going numb and then his feet and he said it isnt getting any better

## 2018-07-09 NOTE — Telephone Encounter (Signed)
I called him and let him know.

## 2018-07-17 DIAGNOSIS — M48062 Spinal stenosis, lumbar region with neurogenic claudication: Secondary | ICD-10-CM | POA: Diagnosis not present

## 2018-07-17 DIAGNOSIS — Z6833 Body mass index (BMI) 33.0-33.9, adult: Secondary | ICD-10-CM | POA: Diagnosis not present

## 2018-07-31 ENCOUNTER — Other Ambulatory Visit: Payer: Self-pay | Admitting: Internal Medicine

## 2018-08-07 ENCOUNTER — Other Ambulatory Visit: Payer: Self-pay | Admitting: Neurosurgery

## 2018-08-07 DIAGNOSIS — M48062 Spinal stenosis, lumbar region with neurogenic claudication: Secondary | ICD-10-CM

## 2018-08-20 ENCOUNTER — Other Ambulatory Visit: Payer: Self-pay

## 2018-08-20 ENCOUNTER — Ambulatory Visit
Admission: RE | Admit: 2018-08-20 | Discharge: 2018-08-20 | Disposition: A | Discharge status: Home / Self Care | Payer: Medicare Other | Source: Ambulatory Visit | Attending: Neurosurgery | Admitting: Neurosurgery

## 2018-08-20 ENCOUNTER — Other Ambulatory Visit: Payer: Self-pay | Admitting: Neurosurgery

## 2018-08-20 DIAGNOSIS — M48062 Spinal stenosis, lumbar region with neurogenic claudication: Secondary | ICD-10-CM

## 2018-08-20 DIAGNOSIS — M48061 Spinal stenosis, lumbar region without neurogenic claudication: Secondary | ICD-10-CM | POA: Diagnosis not present

## 2018-08-21 DIAGNOSIS — M48062 Spinal stenosis, lumbar region with neurogenic claudication: Secondary | ICD-10-CM | POA: Diagnosis not present

## 2018-09-11 DIAGNOSIS — I10 Essential (primary) hypertension: Secondary | ICD-10-CM | POA: Diagnosis not present

## 2018-09-11 DIAGNOSIS — M48062 Spinal stenosis, lumbar region with neurogenic claudication: Secondary | ICD-10-CM | POA: Diagnosis not present

## 2018-09-11 DIAGNOSIS — Z6834 Body mass index (BMI) 34.0-34.9, adult: Secondary | ICD-10-CM | POA: Diagnosis not present

## 2018-10-10 DIAGNOSIS — G9009 Other idiopathic peripheral autonomic neuropathy: Secondary | ICD-10-CM

## 2018-10-15 ENCOUNTER — Telehealth: Payer: Self-pay | Admitting: Internal Medicine

## 2018-10-15 NOTE — Telephone Encounter (Signed)
[facility]  Fax [phone]  A representative from [facility] called to say that Harold [name] wanted compound RX sent to them because [place] was to expensive. So I faxed to them

## 2018-11-15 ENCOUNTER — Other Ambulatory Visit: Payer: Self-pay | Admitting: Internal Medicine

## 2018-11-21 ENCOUNTER — Other Ambulatory Visit: Payer: Medicare Other | Admitting: Internal Medicine

## 2018-11-21 ENCOUNTER — Other Ambulatory Visit: Payer: Self-pay

## 2018-11-21 DIAGNOSIS — B009 Herpesviral infection, unspecified: Secondary | ICD-10-CM | POA: Diagnosis not present

## 2018-11-21 DIAGNOSIS — Z0000 Encounter for general adult medical examination without abnormal findings: Secondary | ICD-10-CM

## 2018-11-21 DIAGNOSIS — G609 Hereditary and idiopathic neuropathy, unspecified: Secondary | ICD-10-CM | POA: Diagnosis not present

## 2018-11-21 DIAGNOSIS — F411 Generalized anxiety disorder: Secondary | ICD-10-CM | POA: Diagnosis not present

## 2018-11-21 DIAGNOSIS — N400 Enlarged prostate without lower urinary tract symptoms: Secondary | ICD-10-CM

## 2018-11-21 DIAGNOSIS — E7800 Pure hypercholesterolemia, unspecified: Secondary | ICD-10-CM

## 2018-11-21 DIAGNOSIS — I10 Essential (primary) hypertension: Secondary | ICD-10-CM

## 2018-11-22 LAB — CBC WITH DIFFERENTIAL/PLATELET
Absolute Monocytes: 519 cells/uL (ref 200–950)
Basophils Absolute: 49 cells/uL (ref 0–200)
Basophils Relative: 0.8 %
Eosinophils Absolute: 0 cells/uL — ABNORMAL LOW (ref 15–500)
Eosinophils Relative: 0 %
HCT: 48.2 % (ref 38.5–50.0)
Hemoglobin: 16.5 g/dL (ref 13.2–17.1)
Lymphs Abs: 1952 cells/uL (ref 850–3900)
MCH: 32.2 pg (ref 27.0–33.0)
MCHC: 34.2 g/dL (ref 32.0–36.0)
MCV: 94 fL (ref 80.0–100.0)
MPV: 9.6 fL (ref 7.5–12.5)
Monocytes Relative: 8.5 %
Neutro Abs: 3581 cells/uL (ref 1500–7800)
Neutrophils Relative %: 58.7 %
Platelets: 251 10*3/uL (ref 140–400)
RBC: 5.13 10*6/uL (ref 4.20–5.80)
RDW: 11.8 % (ref 11.0–15.0)
Total Lymphocyte: 32 %
WBC: 6.1 10*3/uL (ref 3.8–10.8)

## 2018-11-22 LAB — COMPLETE METABOLIC PANEL WITH GFR
AG Ratio: 2 (calc) (ref 1.0–2.5)
ALT: 19 U/L (ref 9–46)
AST: 17 U/L (ref 10–35)
Albumin: 4.2 g/dL (ref 3.6–5.1)
Alkaline phosphatase (APISO): 59 U/L (ref 35–144)
BUN: 16 mg/dL (ref 7–25)
CO2: 28 mmol/L (ref 20–32)
Calcium: 9.5 mg/dL (ref 8.6–10.3)
Chloride: 103 mmol/L (ref 98–110)
Creat: 0.79 mg/dL (ref 0.70–1.18)
GFR, Est African American: 104 mL/min/{1.73_m2} (ref >=60)
GFR, Est Non African American: 90 mL/min/{1.73_m2} (ref >=60)
Globulin: 2.1 g/dL (calc) (ref 1.9–3.7)
Glucose, Bld: 100 mg/dL — ABNORMAL HIGH (ref 65–99)
Potassium: 4.3 mmol/L (ref 3.5–5.3)
Sodium: 139 mmol/L (ref 135–146)
Total Bilirubin: 0.7 mg/dL (ref 0.2–1.2)
Total Protein: 6.3 g/dL (ref 6.1–8.1)

## 2018-11-22 LAB — LIPID PANEL
Cholesterol: 186 mg/dL (ref <200)
HDL: 39 mg/dL — ABNORMAL LOW (ref >=40)
LDL Cholesterol (Calc): 126 mg/dL (calc) — ABNORMAL HIGH
Non-HDL Cholesterol (Calc): 147 mg/dL (calc) — ABNORMAL HIGH (ref <130)
Total CHOL/HDL Ratio: 4.8 (calc) (ref <5.0)
Triglycerides: 104 mg/dL (ref <150)

## 2018-11-22 LAB — PSA: PSA: 3.2 ng/mL (ref <=4.0)

## 2018-11-25 ENCOUNTER — Other Ambulatory Visit: Payer: Medicare Other | Admitting: Internal Medicine

## 2018-11-26 ENCOUNTER — Ambulatory Visit (INDEPENDENT_AMBULATORY_CARE_PROVIDER_SITE_OTHER): Payer: Medicare Other | Admitting: Internal Medicine

## 2018-11-26 ENCOUNTER — Encounter: Payer: Self-pay | Admitting: Internal Medicine

## 2018-11-26 ENCOUNTER — Other Ambulatory Visit: Payer: Self-pay

## 2018-11-26 VITALS — BP 120/80 | HR 67 | Temp 97.6°F | Ht 74.0 in | Wt 256.0 lb

## 2018-11-26 DIAGNOSIS — Z86010 Personal history of colonic polyps: Secondary | ICD-10-CM

## 2018-11-26 DIAGNOSIS — I10 Essential (primary) hypertension: Secondary | ICD-10-CM

## 2018-11-26 DIAGNOSIS — N400 Enlarged prostate without lower urinary tract symptoms: Secondary | ICD-10-CM | POA: Diagnosis not present

## 2018-11-26 DIAGNOSIS — G609 Hereditary and idiopathic neuropathy, unspecified: Secondary | ICD-10-CM

## 2018-11-26 DIAGNOSIS — E7800 Pure hypercholesterolemia, unspecified: Secondary | ICD-10-CM | POA: Diagnosis not present

## 2018-11-26 DIAGNOSIS — Z0000 Encounter for general adult medical examination without abnormal findings: Secondary | ICD-10-CM

## 2018-11-26 DIAGNOSIS — N529 Male erectile dysfunction, unspecified: Secondary | ICD-10-CM

## 2018-11-26 DIAGNOSIS — B009 Herpesviral infection, unspecified: Secondary | ICD-10-CM | POA: Diagnosis not present

## 2018-11-26 LAB — POCT URINALYSIS DIPSTICK
Bilirubin, UA: NEGATIVE
Blood, UA: NEGATIVE
Glucose, UA: NEGATIVE
Ketones, UA: NEGATIVE
Leukocytes, UA: NEGATIVE
Nitrite, UA: NEGATIVE
Protein, UA: NEGATIVE
Spec Grav, UA: 1.015 (ref 1.010–1.025)
Urobilinogen, UA: 0.2 E.U./dL
pH, UA: 6 (ref 5.0–8.0)

## 2018-11-26 NOTE — Patient Instructions (Signed)
Continue to work on diet and exercise.  Continue same medications for hypertension.  It was a pleasure to see you today.  Follow-up in 1 year.

## 2018-11-26 NOTE — Progress Notes (Signed)
Subjective:    Patient ID: Harold [name]., male    DOB: 11-09-1945, 73 y.o.   MRN: [id number]  HPI  73 year old Male for health maintenance exam and evaluation of medical issues.  He sees urologist, Dr. Estill Dooms in [facility].  His PSA is normal.  History of BPH and erectile dysfunction.  Takes Valtrex daily by mouth for HSV type I infection.  Longstanding history of numbness in feet for which he is used Neurontin cream without a lot of relief.  B12 level has been checked in the past.  History of hypertension hyperlipidemia and anxiety.  Past medical history: Hepatitis A in 1963-05[birth year]21.  Fractured nose at age 56, tonsillectomy 55.  Nasal septal deviation surgery February 2004.  Cervical disc surgery April 2007.  He has a healthcare power of attorney and has provided [place] with those documents.  Colonoscopy May 20, 2012 by Dr. Olevia Perches with tubular adenoma being removed from the ascending colon.  Need to check on recall.  Noted in chart under media May 20, 2017 indicates recall may not be necessary due to a but he did have tubular adenoma.  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  [place]  He is married.  Graduate of [employer].  Enjoys golf.  Does not smoke.  He used to smoke but quit around May 20, 1997.  Social alcohol consumption.  Bilateral carpal tunnel surgery Spring 2017 by Dr. Amedeo Plenty.  Family history: Father died at age 39 with MI with history of diabetes and hypertension.  Mother died of complications of Alzheimer's disease.  1 brother in good health.  1 sister with history of nephrotic syndrome.  Son deceased in 2016-05-20.  History of nummular dermatitis seen by Dr.Tafeen.  History of basal cell carcinoma right ear.    Review of Systems  Constitutional: Negative.   Respiratory: Negative.   Cardiovascular: Negative.   Gastrointestinal: Negative.   Neurological: Negative.   Psychiatric/Behavioral: Negative.        Objective:   Physical  Exam Vitals signs reviewed.  Constitutional:      General: He is not in acute distress.    Appearance: Normal appearance.  HENT:     Head: Normocephalic and atraumatic.     Right Ear: Tympanic membrane normal.     Left Ear: Tympanic membrane normal.     Nose: Nose normal.     Mouth/Throat:     Comments: Due to the pandemic not examined Eyes:     General: No scleral icterus.    Conjunctiva/sclera: Conjunctivae normal.     Pupils: Pupils are equal, round, and reactive to light.  Neck:     Musculoskeletal: Neck supple. No neck rigidity.  Cardiovascular:     Rate and Rhythm: Normal rate and regular rhythm.     Heart sounds: Normal heart sounds. No murmur.  Pulmonary:     Effort: Pulmonary effort is normal. No respiratory distress.     Breath sounds: Normal breath sounds. No wheezing or rales.  Abdominal:     General: Bowel sounds are normal.     Palpations: Abdomen is soft. There is no mass.     Tenderness: There is no abdominal tenderness. There is no guarding.  Genitourinary:    Comments: Deferred to urologist Musculoskeletal:     Right lower leg: No edema.     Left lower leg: No edema.  Lymphadenopathy:     Cervical: No cervical adenopathy.  Skin:    General:  Skin is warm and dry.  Neurological:     General: No focal deficit present.     Mental Status: He is alert.     Cranial Nerves: No cranial nerve deficit.     Motor: No weakness.     Gait: Gait normal.  Psychiatric:        Mood and Affect: Mood normal.        Behavior: Behavior normal.        Thought Content: Thought content normal.        Judgment: Judgment normal.           Assessment & Plan:  Essential hypertension-blood pressure excellent 120/80.  Continue current medications  BPH-seen by urologist in [facility].  Prostate exam deferred to him.  Erectile dysfunction treated with Cialis per urologist  History of tubular adenoma removed by Dr. Olevia Perches in 2014.  Patient to follow-up with levallorphan  regarding repeat screening if necessary.  History of penile condyloma treated by urologist  Hyperlipidemia-lipid panel liver functions are normal  Longstanding history of peripheral neuropathy in feet.  B12 deficiency has been ruled out  History of anxiety-stable  Allergic rhinitis  History of HSV type I treated with daily Valtrex  Plan: Continue current medications and follow-up in 1 year or as needed.  Subjective:   Patient presents for Medicare Annual/Subsequent preventive examination.  Review Past Medical/Family/Social: See above   Risk Factors  Current exercise habits: Plays golf and walks Dietary issues discussed: Low-fat low carbohydrate  Cardiac risk factors: Hypertension and hyperlipidemia, family history of MI in father Depression Screen  (Note: if answer to either of the following is "Yes", a more complete depression screening is indicated)   Over the past two weeks, have you felt down, depressed or hopeless? No  Over the past two weeks, have you felt little interest or pleasure in doing things? No Have you lost interest or pleasure in daily life? No Do you often feel hopeless? No Do you cry easily over simple problems? No   Activities of Daily Living  In your present state of health, do you have any difficulty performing the following activities?:   Driving? No  Managing money? No  Feeding yourself? No  Getting from bed to chair? No  Climbing a flight of stairs? No  Preparing food and eating?: No  Bathing or showering? No  Getting dressed: No  Getting to the toilet? No  Using the toilet:No  Moving around from place to place: No  In the past year have you fallen or had a near fall?:No  Are you sexually active?  Yes Do you have more than one partner? No   Hearing Difficulties: No  Do you often ask people to speak up or repeat themselves? No  Do you experience ringing or noises in your ears? No  Do you have difficulty understanding soft or whispered  voices? No  Do you feel that you have a problem with memory? No Do you often misplace items?  Yes  Home Safety:  Do you have a smoke alarm at your residence? Yes Do you have grab bars in the bathroom?  Yes Do you have throw rugs in your house?  Yes   Cognitive Testing  Alert? Yes Normal Appearance?Yes  Oriented to person? Yes Place? Yes  Time? Yes  Recall of three objects? Yes  Can perform simple calculations? Yes  Displays appropriate judgment?Yes  Can read the correct time from a watch face?Yes   List the Names of  Other Physician/Practitioners you currently use:  See referral list for the physicians patient is currently seeing.     Review of Systems: See above   Objective:     General appearance: Appears younger than stated age Head: Normocephalic, without obvious abnormality, atraumatic  Eyes: conj clear, EOMi PEERLA  Ears: normal TM's and external ear canals both ears  Nose: Nares normal. Septum midline. Mucosa normal. No drainage or sinus tenderness.  Throat: lips, mucosa, and tongue normal; teeth and gums normal  Neck: no adenopathy, no carotid bruit, no JVD, supple, symmetrical, trachea midline and thyroid not enlarged, symmetric, no tenderness/mass/nodules  No CVA tenderness.  Lungs: clear to auscultation bilaterally  Breasts: normal appearance, no masses or tenderness Heart: regular rate and rhythm, S1, S2 normal, no murmur, click, rub or gallop  Abdomen: soft, non-tender; bowel sounds normal; no masses, no organomegaly  Musculoskeletal: ROM normal in all joints, no crepitus, no deformity, Normal muscle strengthen. Back  is symmetric, no curvature. Skin: Skin color, texture, turgor normal. No rashes or lesions  Lymph nodes: Cervical, supraclavicular, and axillary nodes normal.  Neurologic: CN 2 -12 Normal, Normal symmetric reflexes. Normal coordination and gait  Psych: Alert & Oriented x 3, Mood appear stable.    Assessment:    Annual wellness medicare exam    Plan:    During the course of the visit the patient was educated and counseled about appropriate screening and preventive services including:   Annual flu vaccine recommended  Defer Shingrix vaccine until pandemic has been provided  Tetanus immunization is up-to-date     Patient Instructions (the written plan) was given to the patient.  Medicare Attestation  I have personally reviewed:  The patient's medical and social history  Their use of alcohol, tobacco or illicit drugs  Their current medications and supplements  The patient's functional ability including ADLs,fall risks, home safety risks, cognitive, and hearing and visual impairment  Diet and physical activities  Evidence for depression or mood disorders  The patient's weight, height, BMI, and visual acuity have been recorded in the chart. I have made referrals, counseling, and provided education to the patient based on review of the above and I have provided the patient with a written personalized care plan for preventive services.

## 2018-11-27 ENCOUNTER — Telehealth: Payer: Self-pay | Admitting: Internal Medicine

## 2018-11-27 NOTE — Telephone Encounter (Signed)
Called and spoke with Lia at [facility], we reviewed chart together and she is going to reach out to patient to get colonoscopy scheduled. It appears letter was mailed to patient, with no response, however he does need colonoscopy.

## 2018-12-02 DIAGNOSIS — Z23 Encounter for immunization: Secondary | ICD-10-CM | POA: Diagnosis not present

## 2018-12-30 ENCOUNTER — Other Ambulatory Visit: Payer: Self-pay

## 2019-02-12 DIAGNOSIS — M48062 Spinal stenosis, lumbar region with neurogenic claudication: Secondary | ICD-10-CM | POA: Diagnosis not present

## 2019-04-03 ENCOUNTER — Ambulatory Visit (INDEPENDENT_AMBULATORY_CARE_PROVIDER_SITE_OTHER): Payer: Medicare Other | Admitting: Orthopaedic Surgery

## 2019-04-03 ENCOUNTER — Other Ambulatory Visit: Payer: Self-pay

## 2019-04-03 ENCOUNTER — Ambulatory Visit (INDEPENDENT_AMBULATORY_CARE_PROVIDER_SITE_OTHER): Payer: Medicare Other

## 2019-04-03 DIAGNOSIS — M25571 Pain in right ankle and joints of right foot: Secondary | ICD-10-CM

## 2019-04-03 NOTE — Progress Notes (Signed)
Office Visit Note   Patient: Harold [name].           Date of Birth: [birth year]           MRN: [id number] Visit Date: 04/03/2019              Requested by: Elby Showers, MD [address] [place],  [place] [address] PCP: Elby Showers, MD   Assessment & Plan: Visit Diagnoses:  1. Pain in right ankle and joints of right foot     Plan: From the picture that I see from his camera phone I do feel this is likely some type of cyst.  If this develops again and gets larger I am happy to see him back for an aspiration and steroid injection in this area.  Since there is nothing going on with the right now he is going to hold off on any type of just given steroid injection.  I have recommended Voltaren gel to this area.  All question concerns were answered and addressed.  If he does develop swelling again he is to call us to be worked in.  Follow-Up Instructions: Return if symptoms worsen or fail to improve.   Orders:  Orders Placed This Encounter  Procedures  . XR Foot Complete Right   No orders of the defined types were placed in this encounter.     Procedures: No procedures performed   Clinical Data: No additional findings.   Subjective: Chief Complaint  Patient presents with  . Right Foot - Pain  The patient is coming in today for evaluation treatment of chronic right foot pain.  He reports that he had some type of hairline fracture back lives in high school.  He is now 74 years old.  He did show me a photograph of his foot just last week on his phone and it did show an area of the lateral dorsal foot that was swollen almost consistent with a cyst.  That is since gone today but this is the type of swelling that he experiences and there is some associated pain with this.  He does get numbness and tingling and pain weakness radiating down both his legs.  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  He has had epidural steroid injections in the past.  He is here today for me for his right foot.  He is not a smoker and not a diabetic.  He does report numbness and tingling in his feet which is likely related to his lumbar spine issues.  HPI  Review of Systems There is currently no fever, chills, nausea, vomiting  Objective: Vital Signs: There were no vitals taken for this visit.  Physical Exam He is alert and orient x3 and in no acute distress Ortho Exam I examined both feet together.  There is only some just slight swelling of the soft tissue over the sinus tarsus area of the right foot with comparing to the right and left foot but there was no palpable mass.  The remainder of his foot and ankle exam on both sides is entirely normal.  He has a normal arch.  His foot is well-perfused and his ankle and foot are stable on stressing. Specialty Comments:  No specialty comments available.  Imaging: No results found.   PMFS History: Patient Active Problem List   Diagnosis Date Noted  . BPH (  benign prostatic hyperplasia) 12/05/2017  . Hereditary and idiopathic peripheral neuropathy 09/06/2014  . Obesity, unspecified 08/18/2012  . Erectile dysfunction 08/18/2012  . Allergic rhinitis 08/18/2012  . Hypertension 12/21/2010  . Anxiety 12/21/2010  . Herpes simplex type 1 infection 12/21/2010   Past Medical History:  Diagnosis Date  . Allergy   . BCC (basal cell carcinoma of skin) 11/20/2017   right ear rim Mohs  . Dependent edema   . Granuloma annulare   . Hyperlipidemia   . Hypertension   . Vitamin D deficiency     Family History  Problem Relation Age of Onset  . Heart disease Father   . Diabetes Father   . Hypertension Father   . Kidney disease Sister   . Colon cancer Neg Hx     Past Surgical History:  Procedure Laterality Date  . Birthmark removed  1948   at [facility]  . [place] SURGERY  2004  . COLONOSCOPY  06-03-2002   "scope not long  enough" per pt.  Marland Kitchen KNEE SURGERY  2008  . NASAL SEPTUM SURGERY    . SHOULDER ARTHROSCOPY  04/29/09   subacromial decompression, labral debridement, rotator cuff debridement, open reconstruction of complex rotator interval/supraspinatus rotator cuff tear  . TONSILLECTOMY     Social History   Occupational History  . Not on file  Tobacco Use  . Smoking status: Former Smoker    Packs/day: 0.25    Types: Cigarettes    Quit date: 02/06/1997    Years since quitting: 22.1  . Smokeless tobacco: Never Used  Substance and Sexual Activity  . Alcohol use: Yes    Alcohol/week: 7.0 standard drinks    Types: 7 Glasses of wine per week  . Drug use: No  . Sexual activity: Yes

## 2019-04-22 ENCOUNTER — Other Ambulatory Visit: Payer: Self-pay | Admitting: Internal Medicine

## 2019-09-02 ENCOUNTER — Other Ambulatory Visit: Payer: Self-pay | Admitting: Internal Medicine

## 2019-11-21 ENCOUNTER — Other Ambulatory Visit: Payer: Self-pay

## 2019-11-21 ENCOUNTER — Other Ambulatory Visit: Payer: Medicare Other | Admitting: Internal Medicine

## 2019-11-21 DIAGNOSIS — E7800 Pure hypercholesterolemia, unspecified: Secondary | ICD-10-CM

## 2019-11-21 DIAGNOSIS — G609 Hereditary and idiopathic neuropathy, unspecified: Secondary | ICD-10-CM

## 2019-11-21 DIAGNOSIS — Z0000 Encounter for general adult medical examination without abnormal findings: Secondary | ICD-10-CM

## 2019-11-21 DIAGNOSIS — N400 Enlarged prostate without lower urinary tract symptoms: Secondary | ICD-10-CM

## 2019-11-21 DIAGNOSIS — B009 Herpesviral infection, unspecified: Secondary | ICD-10-CM

## 2019-11-21 DIAGNOSIS — Z125 Encounter for screening for malignant neoplasm of prostate: Secondary | ICD-10-CM

## 2019-11-22 LAB — LIPID PANEL
Cholesterol: 165 mg/dL (ref <200)
HDL: 44 mg/dL (ref >=40)
LDL Cholesterol (Calc): 101 mg/dL (calc) — ABNORMAL HIGH
Non-HDL Cholesterol (Calc): 121 mg/dL (calc) (ref <130)
Total CHOL/HDL Ratio: 3.8 (calc) (ref <5.0)
Triglycerides: 100 mg/dL (ref <150)

## 2019-11-22 LAB — CBC WITH DIFFERENTIAL/PLATELET
Absolute Monocytes: 572 cells/uL (ref 200–950)
Basophils Absolute: 52 cells/uL (ref 0–200)
Basophils Relative: 0.8 %
Eosinophils Absolute: 163 cells/uL (ref 15–500)
Eosinophils Relative: 2.5 %
HCT: 47.7 % (ref 38.5–50.0)
Hemoglobin: 16.3 g/dL (ref 13.2–17.1)
Lymphs Abs: 1931 cells/uL (ref 850–3900)
MCH: 32.1 pg (ref 27.0–33.0)
MCHC: 34.2 g/dL (ref 32.0–36.0)
MCV: 93.9 fL (ref 80.0–100.0)
MPV: 9.7 fL (ref 7.5–12.5)
Monocytes Relative: 8.8 %
Neutro Abs: 3783 cells/uL (ref 1500–7800)
Neutrophils Relative %: 58.2 %
Platelets: 232 10*3/uL (ref 140–400)
RBC: 5.08 10*6/uL (ref 4.20–5.80)
RDW: 11.8 % (ref 11.0–15.0)
Total Lymphocyte: 29.7 %
WBC: 6.5 10*3/uL (ref 3.8–10.8)

## 2019-11-22 LAB — COMPLETE METABOLIC PANEL WITH GFR
AG Ratio: 1.9 (calc) (ref 1.0–2.5)
ALT: 29 U/L (ref 9–46)
AST: 20 U/L (ref 10–35)
Albumin: 4.1 g/dL (ref 3.6–5.1)
Alkaline phosphatase (APISO): 61 U/L (ref 35–144)
BUN: 14 mg/dL (ref 7–25)
CO2: 28 mmol/L (ref 20–32)
Calcium: 9.5 mg/dL (ref 8.6–10.3)
Chloride: 103 mmol/L (ref 98–110)
Creat: 0.85 mg/dL (ref 0.70–1.18)
GFR, Est African American: 100 mL/min/{1.73_m2} (ref >=60)
GFR, Est Non African American: 86 mL/min/{1.73_m2} (ref >=60)
Globulin: 2.2 g/dL (calc) (ref 1.9–3.7)
Glucose, Bld: 102 mg/dL — ABNORMAL HIGH (ref 65–99)
Potassium: 4.2 mmol/L (ref 3.5–5.3)
Sodium: 140 mmol/L (ref 135–146)
Total Bilirubin: 0.8 mg/dL (ref 0.2–1.2)
Total Protein: 6.3 g/dL (ref 6.1–8.1)

## 2019-11-22 LAB — PSA: PSA: 3.71 ng/mL (ref <=4.0)

## 2019-11-24 ENCOUNTER — Other Ambulatory Visit: Payer: Medicare Other | Admitting: Internal Medicine

## 2019-11-28 ENCOUNTER — Other Ambulatory Visit: Payer: Medicare Other | Admitting: Internal Medicine

## 2019-12-01 ENCOUNTER — Ambulatory Visit (INDEPENDENT_AMBULATORY_CARE_PROVIDER_SITE_OTHER): Payer: Medicare Other | Admitting: Internal Medicine

## 2019-12-01 ENCOUNTER — Other Ambulatory Visit: Payer: Self-pay

## 2019-12-01 ENCOUNTER — Encounter: Payer: Self-pay | Admitting: Internal Medicine

## 2019-12-01 VITALS — BP 120/70 | HR 72 | Ht 74.0 in | Wt 253.0 lb

## 2019-12-01 DIAGNOSIS — Z0000 Encounter for general adult medical examination without abnormal findings: Secondary | ICD-10-CM | POA: Diagnosis not present

## 2019-12-01 DIAGNOSIS — N400 Enlarged prostate without lower urinary tract symptoms: Secondary | ICD-10-CM

## 2019-12-01 DIAGNOSIS — B009 Herpesviral infection, unspecified: Secondary | ICD-10-CM

## 2019-12-01 DIAGNOSIS — N529 Male erectile dysfunction, unspecified: Secondary | ICD-10-CM

## 2019-12-01 DIAGNOSIS — I10 Essential (primary) hypertension: Secondary | ICD-10-CM | POA: Diagnosis not present

## 2019-12-01 DIAGNOSIS — Z86010 Personal history of colonic polyps: Secondary | ICD-10-CM

## 2019-12-01 DIAGNOSIS — G609 Hereditary and idiopathic neuropathy, unspecified: Secondary | ICD-10-CM

## 2019-12-01 LAB — POCT URINALYSIS DIPSTICK
Appearance: NEGATIVE
Bilirubin, UA: NEGATIVE
Blood, UA: NEGATIVE
Glucose, UA: NEGATIVE
Ketones, UA: NEGATIVE
Leukocytes, UA: NEGATIVE
Nitrite, UA: NEGATIVE
Odor: NEGATIVE
Protein, UA: NEGATIVE
Spec Grav, UA: 1.015 (ref 1.010–1.025)
Urobilinogen, UA: 0.2 E.U./dL
pH, UA: 6 (ref 5.0–8.0)

## 2019-12-01 NOTE — Patient Instructions (Signed)
He declines Shingrix vaccine as he had arm soreness and swelling for several weeks after Zostavax and also is on Valtrex daily. He will see if [facility] has PT available there to help with strengthening of core. Needs to lose some weight. Does not want to be on Meloxicam for musculoskeletal pain. Lesion on right foot is likely a benign growth but can be excised by ortho if necessary. Labs are stable including PSA. RTC one year or as needed.

## 2019-12-01 NOTE — Progress Notes (Signed)
Subjective:    Patient ID: Harold [name]., male    DOB: 1945/04/16, 74 y.o.   MRN: [id number]  HPI 74 year old Male seen for Medicare wellness, health maintenance exam and evaluation of medical issues.  History of hypertension, hyperlipidemia and anxiety.  Longstanding history of numbness in feet for which he has used Neurontin cream without a lot of relief.  B12 level has been checked in the past.  Takes Valtrex daily bilateral for HSV type I infection.  He has a Special educational needs teacher of attorney and has provided [place] with those documents.  Past medical history: Hepatitis 80 1965.  Fractured nose at age 46, tonsillectomy 76.  Nasal septum deviation surgery February 2004.  Cervical disc surgery April 2007.  Colonoscopy done May 07, 2012 by Dr. Maurene Capes with tubular adenoma being removed from the ascending colon.  I have recommended repeat study since he is in excellent health.  This may be his last colonoscopy if normal.  Bilateral carpal tunnel surgery spring 2017 by Dr. Amedeo Plenty.  History of nummular dermatitis seen by Dr. Denna Haggard.  History of basal cell carcinoma right ear.  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  He resides in [place].  He is married.  Graduate of [employer].  Does not smoke.  Social alcohol consumption.  He used to smoke but quit around 05/07/97.  Son deceased in 05/07/2016.  Family history: Father died at age 64 with MI with history of diabetes and hypertension.  Mother died with complications of Alzheimer's disease.  1 brother in good health.  1 sister with history of nephrotic syndrome.    General health is quite good. Had flu vaccine October 7th.  Has had 2 Moderna Covid-19 vaccines.   Has spinal stenosis and has received injections per Dr. Brien Few. Has seen Dr. Annette Stable but surgery not advised.   Has nodule right lateral foot that concerns him, although not painful. Right knee is painful. Has seen Dr. Ninfa Linden. Dx with osteoarthritis  of right knee knee.  Hx of BPH seen by Dr. Estill Dooms in [facility] about 6 months ago. He is treated with Cialis and Flomax.   He  divides his time between [facility] and [place]. Plays golf. Went to [employer] at [employer] for adults. Has a boat and does some fishing.    Review of Systems see new issues above. Due for colonoscopy. Referral made.     Objective:   Physical Exam Vitals reviewed.  Constitutional:      Appearance: Normal appearance.  HENT:     Head: Normocephalic and atraumatic.     Right Ear: Tympanic membrane normal.     Left Ear: Tympanic membrane normal.     Nose: Nose normal.  Eyes:     Extraocular Movements: Extraocular movements intact.     Conjunctiva/sclera: Conjunctivae normal.     Pupils: Pupils are equal, round, and reactive to light.  Neck:     Vascular: No carotid bruit.  Cardiovascular:     Rate and Rhythm: Normal rate and regular rhythm.     Pulses: Normal pulses.     Heart sounds: Normal heart sounds. No murmur heard.   Pulmonary:     Effort: Pulmonary effort is normal. No respiratory distress.     Breath sounds: Normal breath sounds. No wheezing or rales.  Abdominal:     General: Bowel sounds are normal. There is no distension.     Palpations: Abdomen is soft.  Tenderness: There is no abdominal tenderness. There is no right CVA tenderness or guarding.  Genitourinary:    Comments: Deferred to Urologist Musculoskeletal:     Cervical back: Neck supple. No rigidity.     Right lower leg: No edema.     Left lower leg: No edema.  Lymphadenopathy:     Cervical: No cervical adenopathy.  Skin:    General: Skin is warm and dry.     Findings: No rash.     Comments: Spongy lesion right lateral foot ? Cyst or benign growth? Can be seen and checked by orthopedist.  Neurological:     General: No focal deficit present.     Mental Status: He is alert and oriented to person, place, and time.     Cranial Nerves: No cranial nerve deficit.      Sensory: No sensory deficit.     Motor: No weakness.     Gait: Gait normal.  Psychiatric:        Mood and Affect: Mood normal.        Behavior: Behavior normal.        Thought Content: Thought content normal.        Judgment: Judgment normal.    He has a quarter sized nodule right lateral foot that is not painful but a bit spongy.  Suspect this is benign growth.  It can be removed by orthopedist if he so desires.       Assessment & Plan:  Labs are stable including PSA..  Lipid panel is normal.  He does not take statin medication.  Weight is 253 pounds-BMI 32.48.  Has gained 15 pounds since 2018 but was 258 pounds in 2019.  He was 256 pounds in 2020.  Essential hypertension stable on HCTZ and losartan and metoprolol  BPH: Flomax and Cialis are prescribed by Urology in [facility].   Declines Shingrix due to prior severe reaction with Zostavax and also takes daily Valtrex- so unliikely to have Shingles.   RTC in one year or as needed.  He is to  see if [facility] in [facility] has PT to work with him. Needs to lose weight.

## 2019-12-08 ENCOUNTER — Telehealth: Payer: Self-pay

## 2019-12-08 NOTE — Telephone Encounter (Signed)
Faxed order to [phone]

## 2019-12-08 NOTE — Telephone Encounter (Signed)
Patient called wants an order for [facility] [phone]. He said he talked to you about this at his physical.

## 2019-12-08 NOTE — Telephone Encounter (Signed)
Order sent.

## 2019-12-09 NOTE — Telephone Encounter (Signed)
Faxed office notes, order and demographics to [phone]

## 2019-12-18 ENCOUNTER — Telehealth: Payer: Self-pay | Admitting: Internal Medicine

## 2019-12-18 DIAGNOSIS — M25512 Pain in left shoulder: Secondary | ICD-10-CM | POA: Diagnosis not present

## 2019-12-18 DIAGNOSIS — G8929 Other chronic pain: Secondary | ICD-10-CM | POA: Diagnosis not present

## 2019-12-18 DIAGNOSIS — M25612 Stiffness of left shoulder, not elsewhere classified: Secondary | ICD-10-CM | POA: Diagnosis not present

## 2019-12-18 DIAGNOSIS — M25651 Stiffness of right hip, not elsewhere classified: Secondary | ICD-10-CM | POA: Diagnosis not present

## 2019-12-18 DIAGNOSIS — M25511 Pain in right shoulder: Secondary | ICD-10-CM | POA: Diagnosis not present

## 2019-12-18 DIAGNOSIS — R5381 Other malaise: Secondary | ICD-10-CM | POA: Diagnosis not present

## 2019-12-18 DIAGNOSIS — M25652 Stiffness of left hip, not elsewhere classified: Secondary | ICD-10-CM | POA: Diagnosis not present

## 2019-12-18 DIAGNOSIS — M25611 Stiffness of right shoulder, not elsewhere classified: Secondary | ICD-10-CM | POA: Diagnosis not present

## 2019-12-18 DIAGNOSIS — M48061 Spinal stenosis, lumbar region without neurogenic claudication: Secondary | ICD-10-CM | POA: Diagnosis not present

## 2019-12-18 NOTE — Telephone Encounter (Signed)
Faxed signed Treatment Plan to [facility] [phone], phone number [phone]

## 2019-12-23 DIAGNOSIS — R5381 Other malaise: Secondary | ICD-10-CM | POA: Diagnosis not present

## 2019-12-26 DIAGNOSIS — M48061 Spinal stenosis, lumbar region without neurogenic claudication: Secondary | ICD-10-CM | POA: Diagnosis not present

## 2019-12-26 DIAGNOSIS — M25651 Stiffness of right hip, not elsewhere classified: Secondary | ICD-10-CM | POA: Diagnosis not present

## 2019-12-26 DIAGNOSIS — R5381 Other malaise: Secondary | ICD-10-CM | POA: Diagnosis not present

## 2019-12-26 DIAGNOSIS — M25612 Stiffness of left shoulder, not elsewhere classified: Secondary | ICD-10-CM | POA: Diagnosis not present

## 2019-12-26 DIAGNOSIS — G8929 Other chronic pain: Secondary | ICD-10-CM | POA: Diagnosis not present

## 2019-12-26 DIAGNOSIS — M25611 Stiffness of right shoulder, not elsewhere classified: Secondary | ICD-10-CM | POA: Diagnosis not present

## 2019-12-26 DIAGNOSIS — M25652 Stiffness of left hip, not elsewhere classified: Secondary | ICD-10-CM | POA: Diagnosis not present

## 2019-12-29 DIAGNOSIS — G8929 Other chronic pain: Secondary | ICD-10-CM | POA: Diagnosis not present

## 2019-12-29 DIAGNOSIS — M25612 Stiffness of left shoulder, not elsewhere classified: Secondary | ICD-10-CM | POA: Diagnosis not present

## 2019-12-29 DIAGNOSIS — M25652 Stiffness of left hip, not elsewhere classified: Secondary | ICD-10-CM | POA: Diagnosis not present

## 2019-12-29 DIAGNOSIS — M25611 Stiffness of right shoulder, not elsewhere classified: Secondary | ICD-10-CM | POA: Diagnosis not present

## 2019-12-29 DIAGNOSIS — M25512 Pain in left shoulder: Secondary | ICD-10-CM | POA: Diagnosis not present

## 2019-12-29 DIAGNOSIS — M25511 Pain in right shoulder: Secondary | ICD-10-CM | POA: Diagnosis not present

## 2019-12-29 DIAGNOSIS — R5381 Other malaise: Secondary | ICD-10-CM | POA: Diagnosis not present

## 2019-12-29 DIAGNOSIS — M48061 Spinal stenosis, lumbar region without neurogenic claudication: Secondary | ICD-10-CM | POA: Diagnosis not present

## 2019-12-29 DIAGNOSIS — M25651 Stiffness of right hip, not elsewhere classified: Secondary | ICD-10-CM | POA: Diagnosis not present

## 2020-01-05 DIAGNOSIS — M25511 Pain in right shoulder: Secondary | ICD-10-CM | POA: Diagnosis not present

## 2020-01-05 DIAGNOSIS — M25652 Stiffness of left hip, not elsewhere classified: Secondary | ICD-10-CM | POA: Diagnosis not present

## 2020-01-05 DIAGNOSIS — M25651 Stiffness of right hip, not elsewhere classified: Secondary | ICD-10-CM | POA: Diagnosis not present

## 2020-01-05 DIAGNOSIS — G8929 Other chronic pain: Secondary | ICD-10-CM | POA: Diagnosis not present

## 2020-01-05 DIAGNOSIS — M25612 Stiffness of left shoulder, not elsewhere classified: Secondary | ICD-10-CM | POA: Diagnosis not present

## 2020-01-05 DIAGNOSIS — M25512 Pain in left shoulder: Secondary | ICD-10-CM | POA: Diagnosis not present

## 2020-01-05 DIAGNOSIS — M25611 Stiffness of right shoulder, not elsewhere classified: Secondary | ICD-10-CM | POA: Diagnosis not present

## 2020-01-05 DIAGNOSIS — R5381 Other malaise: Secondary | ICD-10-CM | POA: Diagnosis not present

## 2020-01-13 DIAGNOSIS — M25511 Pain in right shoulder: Secondary | ICD-10-CM | POA: Diagnosis not present

## 2020-01-13 DIAGNOSIS — M48061 Spinal stenosis, lumbar region without neurogenic claudication: Secondary | ICD-10-CM | POA: Diagnosis not present

## 2020-01-13 DIAGNOSIS — M25612 Stiffness of left shoulder, not elsewhere classified: Secondary | ICD-10-CM | POA: Diagnosis not present

## 2020-01-13 DIAGNOSIS — M25512 Pain in left shoulder: Secondary | ICD-10-CM | POA: Diagnosis not present

## 2020-01-13 DIAGNOSIS — M25611 Stiffness of right shoulder, not elsewhere classified: Secondary | ICD-10-CM | POA: Diagnosis not present

## 2020-01-13 DIAGNOSIS — R5381 Other malaise: Secondary | ICD-10-CM | POA: Diagnosis not present

## 2020-01-16 DIAGNOSIS — M48061 Spinal stenosis, lumbar region without neurogenic claudication: Secondary | ICD-10-CM | POA: Diagnosis not present

## 2020-01-16 DIAGNOSIS — M25651 Stiffness of right hip, not elsewhere classified: Secondary | ICD-10-CM | POA: Diagnosis not present

## 2020-01-16 DIAGNOSIS — M25511 Pain in right shoulder: Secondary | ICD-10-CM | POA: Diagnosis not present

## 2020-01-16 DIAGNOSIS — G8929 Other chronic pain: Secondary | ICD-10-CM | POA: Diagnosis not present

## 2020-01-16 DIAGNOSIS — M25652 Stiffness of left hip, not elsewhere classified: Secondary | ICD-10-CM | POA: Diagnosis not present

## 2020-01-16 DIAGNOSIS — R5381 Other malaise: Secondary | ICD-10-CM | POA: Diagnosis not present

## 2020-01-16 DIAGNOSIS — M25512 Pain in left shoulder: Secondary | ICD-10-CM | POA: Diagnosis not present

## 2020-01-20 DIAGNOSIS — M25611 Stiffness of right shoulder, not elsewhere classified: Secondary | ICD-10-CM | POA: Diagnosis not present

## 2020-01-20 DIAGNOSIS — M25651 Stiffness of right hip, not elsewhere classified: Secondary | ICD-10-CM | POA: Diagnosis not present

## 2020-01-20 DIAGNOSIS — M25652 Stiffness of left hip, not elsewhere classified: Secondary | ICD-10-CM | POA: Diagnosis not present

## 2020-01-20 DIAGNOSIS — R5381 Other malaise: Secondary | ICD-10-CM | POA: Diagnosis not present

## 2020-01-20 DIAGNOSIS — M25511 Pain in right shoulder: Secondary | ICD-10-CM | POA: Diagnosis not present

## 2020-01-20 DIAGNOSIS — M25612 Stiffness of left shoulder, not elsewhere classified: Secondary | ICD-10-CM | POA: Diagnosis not present

## 2020-01-20 DIAGNOSIS — M25512 Pain in left shoulder: Secondary | ICD-10-CM | POA: Diagnosis not present

## 2020-01-20 DIAGNOSIS — G8929 Other chronic pain: Secondary | ICD-10-CM | POA: Diagnosis not present

## 2020-01-23 DIAGNOSIS — M48061 Spinal stenosis, lumbar region without neurogenic claudication: Secondary | ICD-10-CM | POA: Diagnosis not present

## 2020-01-23 DIAGNOSIS — M25652 Stiffness of left hip, not elsewhere classified: Secondary | ICD-10-CM | POA: Diagnosis not present

## 2020-01-23 DIAGNOSIS — M25651 Stiffness of right hip, not elsewhere classified: Secondary | ICD-10-CM | POA: Diagnosis not present

## 2020-01-23 DIAGNOSIS — G8929 Other chronic pain: Secondary | ICD-10-CM | POA: Diagnosis not present

## 2020-01-23 DIAGNOSIS — M25612 Stiffness of left shoulder, not elsewhere classified: Secondary | ICD-10-CM | POA: Diagnosis not present

## 2020-01-23 DIAGNOSIS — M25511 Pain in right shoulder: Secondary | ICD-10-CM | POA: Diagnosis not present

## 2020-01-23 DIAGNOSIS — R5381 Other malaise: Secondary | ICD-10-CM | POA: Diagnosis not present

## 2020-01-23 DIAGNOSIS — M25512 Pain in left shoulder: Secondary | ICD-10-CM | POA: Diagnosis not present

## 2020-01-23 DIAGNOSIS — M25611 Stiffness of right shoulder, not elsewhere classified: Secondary | ICD-10-CM | POA: Diagnosis not present

## 2020-01-26 DIAGNOSIS — M25511 Pain in right shoulder: Secondary | ICD-10-CM | POA: Diagnosis not present

## 2020-01-26 DIAGNOSIS — M25652 Stiffness of left hip, not elsewhere classified: Secondary | ICD-10-CM | POA: Diagnosis not present

## 2020-01-26 DIAGNOSIS — M25512 Pain in left shoulder: Secondary | ICD-10-CM | POA: Diagnosis not present

## 2020-01-26 DIAGNOSIS — M25612 Stiffness of left shoulder, not elsewhere classified: Secondary | ICD-10-CM | POA: Diagnosis not present

## 2020-01-26 DIAGNOSIS — G8929 Other chronic pain: Secondary | ICD-10-CM | POA: Diagnosis not present

## 2020-01-26 DIAGNOSIS — M25611 Stiffness of right shoulder, not elsewhere classified: Secondary | ICD-10-CM | POA: Diagnosis not present

## 2020-01-26 DIAGNOSIS — R5381 Other malaise: Secondary | ICD-10-CM | POA: Diagnosis not present

## 2020-01-26 DIAGNOSIS — M25651 Stiffness of right hip, not elsewhere classified: Secondary | ICD-10-CM | POA: Diagnosis not present

## 2020-01-28 DIAGNOSIS — M25612 Stiffness of left shoulder, not elsewhere classified: Secondary | ICD-10-CM | POA: Diagnosis not present

## 2020-01-28 DIAGNOSIS — R5381 Other malaise: Secondary | ICD-10-CM | POA: Diagnosis not present

## 2020-01-28 DIAGNOSIS — G8929 Other chronic pain: Secondary | ICD-10-CM | POA: Diagnosis not present

## 2020-01-28 DIAGNOSIS — M25511 Pain in right shoulder: Secondary | ICD-10-CM | POA: Diagnosis not present

## 2020-01-28 DIAGNOSIS — M25512 Pain in left shoulder: Secondary | ICD-10-CM | POA: Diagnosis not present

## 2020-01-28 DIAGNOSIS — M25651 Stiffness of right hip, not elsewhere classified: Secondary | ICD-10-CM | POA: Diagnosis not present

## 2020-01-28 DIAGNOSIS — M25652 Stiffness of left hip, not elsewhere classified: Secondary | ICD-10-CM | POA: Diagnosis not present

## 2020-01-28 DIAGNOSIS — M25611 Stiffness of right shoulder, not elsewhere classified: Secondary | ICD-10-CM | POA: Diagnosis not present

## 2020-02-02 DIAGNOSIS — G8929 Other chronic pain: Secondary | ICD-10-CM | POA: Diagnosis not present

## 2020-02-02 DIAGNOSIS — R5381 Other malaise: Secondary | ICD-10-CM | POA: Diagnosis not present

## 2020-02-02 DIAGNOSIS — M48061 Spinal stenosis, lumbar region without neurogenic claudication: Secondary | ICD-10-CM | POA: Diagnosis not present

## 2020-02-02 DIAGNOSIS — M25512 Pain in left shoulder: Secondary | ICD-10-CM | POA: Diagnosis not present

## 2020-02-02 DIAGNOSIS — M25611 Stiffness of right shoulder, not elsewhere classified: Secondary | ICD-10-CM | POA: Diagnosis not present

## 2020-02-02 DIAGNOSIS — M25511 Pain in right shoulder: Secondary | ICD-10-CM | POA: Diagnosis not present

## 2020-02-02 DIAGNOSIS — M25612 Stiffness of left shoulder, not elsewhere classified: Secondary | ICD-10-CM | POA: Diagnosis not present

## 2020-02-05 DIAGNOSIS — G8929 Other chronic pain: Secondary | ICD-10-CM | POA: Diagnosis not present

## 2020-02-05 DIAGNOSIS — M25562 Pain in left knee: Secondary | ICD-10-CM | POA: Diagnosis not present

## 2020-02-05 DIAGNOSIS — M25612 Stiffness of left shoulder, not elsewhere classified: Secondary | ICD-10-CM | POA: Diagnosis not present

## 2020-02-05 DIAGNOSIS — R5381 Other malaise: Secondary | ICD-10-CM | POA: Diagnosis not present

## 2020-02-05 DIAGNOSIS — M25611 Stiffness of right shoulder, not elsewhere classified: Secondary | ICD-10-CM | POA: Diagnosis not present

## 2020-02-05 DIAGNOSIS — M25561 Pain in right knee: Secondary | ICD-10-CM | POA: Diagnosis not present

## 2020-02-05 DIAGNOSIS — M48061 Spinal stenosis, lumbar region without neurogenic claudication: Secondary | ICD-10-CM | POA: Diagnosis not present

## 2020-02-13 ENCOUNTER — Other Ambulatory Visit: Payer: Self-pay | Admitting: Internal Medicine

## 2020-03-16 DIAGNOSIS — H04123 Dry eye syndrome of bilateral lacrimal glands: Secondary | ICD-10-CM | POA: Diagnosis not present

## 2020-03-16 DIAGNOSIS — H26492 Other secondary cataract, left eye: Secondary | ICD-10-CM | POA: Diagnosis not present

## 2020-05-18 ENCOUNTER — Encounter: Payer: Self-pay | Admitting: Gastroenterology

## 2020-06-10 ENCOUNTER — Telehealth: Payer: Self-pay | Admitting: Internal Medicine

## 2020-06-10 NOTE — Telephone Encounter (Signed)
[facility] [phone]  Romilda Garret called to say he is about out of below cream that has gabapentin in it that he rubs on his feet and we should receive something from below pharmacy.  [facility]  [facility]

## 2020-06-10 NOTE — Telephone Encounter (Signed)
We have not seen this Rx yet. It is likely to be a fax RX if it is componded

## 2020-06-14 NOTE — Telephone Encounter (Signed)
Received Fax RX request from  [facility] [phone]  Medication - [place]?BUPI?DOXE?GABA?NIFE?TOPI?PENT (3-K7)*3/1/3/6/2/1/3% cream Apply 1 to 2 pumps (1 to 2 gms) to affected area(s) 3-4 times daily 3%  240grams 5 refills  Last Refill - 10/15/2018  Last OV - 12/01/2019  Last CPE - 12/01/2019  Next Appointment - 12/04/2019

## 2020-06-14 NOTE — Telephone Encounter (Signed)
Faxed signed refill request to [facility] [phone] and they called back to confirm they had received.

## 2020-07-15 ENCOUNTER — Ambulatory Visit (AMBULATORY_SURGERY_CENTER): Payer: Medicare Other

## 2020-07-15 VITALS — Ht 74.0 in | Wt 245.0 lb

## 2020-07-15 DIAGNOSIS — Z86010 Personal history of colonic polyps: Secondary | ICD-10-CM

## 2020-07-15 MED ORDER — PEG 3350-KCL-NA BICARB-NACL 420 G PO SOLR: 4000.0000 mL | Freq: Once | ORAL | 0 refills | Status: AC

## 2020-07-15 NOTE — Progress Notes (Signed)

## 2020-07-27 ENCOUNTER — Encounter: Payer: Self-pay | Admitting: Certified Registered Nurse Anesthetist

## 2020-07-28 ENCOUNTER — Ambulatory Visit (AMBULATORY_SURGERY_CENTER): Payer: Medicare Other | Admitting: Gastroenterology

## 2020-07-28 ENCOUNTER — Encounter: Payer: Self-pay | Admitting: Gastroenterology

## 2020-07-28 ENCOUNTER — Other Ambulatory Visit: Payer: Self-pay

## 2020-07-28 VITALS — BP 131/61 | HR 56 | Temp 98.4°F | Resp 15 | Ht 74.0 in | Wt 245.0 lb

## 2020-07-28 DIAGNOSIS — I10 Essential (primary) hypertension: Secondary | ICD-10-CM | POA: Diagnosis not present

## 2020-07-28 DIAGNOSIS — E7800 Pure hypercholesterolemia, unspecified: Secondary | ICD-10-CM | POA: Diagnosis not present

## 2020-07-28 DIAGNOSIS — D123 Benign neoplasm of transverse colon: Secondary | ICD-10-CM | POA: Diagnosis not present

## 2020-07-28 DIAGNOSIS — Z86010 Personal history of colonic polyps: Secondary | ICD-10-CM

## 2020-07-28 DIAGNOSIS — K621 Rectal polyp: Secondary | ICD-10-CM

## 2020-07-28 DIAGNOSIS — D122 Benign neoplasm of ascending colon: Secondary | ICD-10-CM

## 2020-07-28 DIAGNOSIS — D128 Benign neoplasm of rectum: Secondary | ICD-10-CM

## 2020-07-28 MED ORDER — SODIUM CHLORIDE 0.9 % IV SOLN: 500.0000 mL | Freq: Once | INTRAVENOUS | Status: DC

## 2020-07-28 NOTE — Progress Notes (Signed)
Vs by CW in adm 

## 2020-07-28 NOTE — Op Note (Signed)
[facility] Patient Name: Harold [name] Procedure Date: 07/28/2020 2:31 PM MRN: [id number] Endoscopist: Mauri Pole , MD Age: 75 Referring MD:  Date of Birth: July 11, 1945 Gender: Male Account #: [id number] Procedure:                Colonoscopy Indications:              High risk colon cancer surveillance: Personal                            history of colonic polyps Medicines:                Monitored Anesthesia Care Procedure:                Pre-Anesthesia Assessment:                           - Prior to the procedure, a History and Physical                            was performed, and patient medications and                            allergies were reviewed. The patient's tolerance of                            previous anesthesia was also reviewed. The risks                            and benefits of the procedure and the sedation                            options and risks were discussed with the patient.                            All questions were answered, and informed consent                            was obtained. Prior Anticoagulants: The patient has                            taken no previous anticoagulant or antiplatelet                            agents. ASA Grade Assessment: II - A patient with                            mild systemic disease. After reviewing the risks                            and benefits, the patient was deemed in                            satisfactory condition to undergo the procedure.  After obtaining informed consent, the colonoscope                            was passed under direct vision. Throughout the                            procedure, the patient's blood pressure, pulse, and                            oxygen saturations were monitored continuously. The                            Olympus PCF-H190DL ([id number]) Colonoscope was                            introduced through the anus and  advanced to the the                            cecum, identified by appendiceal orifice and                            ileocecal valve. The colonoscopy was performed                            without difficulty. The patient tolerated the                            procedure well. The quality of the bowel                            preparation was good. The ileocecal valve,                            appendiceal orifice, and rectum were photographed. Scope In: 2:50:27 PM Scope Out: 3:13:51 PM Scope Withdrawal Time: 0 hours 12 minutes 33 seconds  Total Procedure Duration: 0 hours 23 minutes 24 seconds  Findings:                 The perianal and digital rectal examinations were                            normal.                           Four sessile polyps were found in the rectum,                            transverse colon and ascending colon. The polyps                            were 4 to 9 mm in size. These polyps were removed                            with a cold snare. Resection and retrieval were  complete.                           Non-bleeding external and internal hemorrhoids were                            found during retroflexion. The hemorrhoids were                            medium-sized. Complications:            No immediate complications. Estimated Blood Loss:     Estimated blood loss was minimal. Impression:               - Four 4 to 9 mm polyps in the rectum, in the                            transverse colon and in the ascending colon,                            removed with a cold snare. Resected and retrieved.                           - Non-bleeding external and internal hemorrhoids. Recommendation:           - Patient has a contact number available for                            emergencies. The signs and symptoms of potential                            delayed complications were discussed with the                            patient.  Return to normal activities tomorrow.                            Written discharge instructions were provided to the                            patient.                           - Resume previous diet.                           - Continue present medications.                           - Await pathology results.                           - Repeat colonoscopy in 3 - 5 years for                            surveillance based on pathology results. Mauri Pole, MD 07/28/2020 3:19:59 PM This report has been signed  electronically.

## 2020-07-28 NOTE — Patient Instructions (Signed)
Discharge instructions given. ?Handouts on polyps and Hemorrhoids. ?Resume previous medications. ?YOU HAD AN ENDOSCOPIC PROCEDURE TODAY AT THE [facility]:   Refer to the procedure report that was given to you for any specific questions about what was found during the examination.  If the procedure report does not answer your questions, please call your gastroenterologist to clarify.  If you requested that your care partner not be given the details of your procedure findings, then the procedure report has been included in a sealed envelope for you to review at your convenience later. ? ?YOU SHOULD EXPECT: Some feelings of bloating in the abdomen. Passage of more gas than usual.  Walking can help get rid of the air that was put into your GI tract during the procedure and reduce the bloating. If you had a lower endoscopy (such as a colonoscopy or flexible sigmoidoscopy) you may notice spotting of blood in your stool or on the toilet paper. If you underwent a bowel prep for your procedure, you may not have a normal bowel movement for a few days. ? ?Please Note:  You might notice some irritation and congestion in your nose or some drainage.  This is from the oxygen used during your procedure.  There is no need for concern and it should clear up in a day or so. ? ?SYMPTOMS TO REPORT IMMEDIATELY: ? ?Following lower endoscopy (colonoscopy or flexible sigmoidoscopy): ? Excessive amounts of blood in the stool ? Significant tenderness or worsening of abdominal pains ? Swelling of the abdomen that is new, acute ? Fever of 100?F or higher ? ? ?For urgent or emergent issues, a gastroenterologist can be reached at any hour by calling [phone]. ?Do not use MyChart messaging for urgent concerns.  ? ? ?DIET:  We do recommend a small meal at first, but then you may proceed to your regular diet.  Drink plenty of fluids but you should avoid alcoholic beverages for 24 hours. ? ?ACTIVITY:  You should plan to take it  easy for the rest of today and you should NOT DRIVE or use heavy machinery until tomorrow (because of the sedation medicines used during the test).   ? ?FOLLOW UP: ?Our staff will call the number listed on your records 48-72 hours following your procedure to check on you and address any questions or concerns that you may have regarding the information given to you following your procedure. If we do not reach you, we will leave a message.  We will attempt to reach you two times.  During this call, we will ask if you have developed any symptoms of COVID 19. If you develop any symptoms (ie: fever, flu-like symptoms, shortness of breath, cough etc.) before then, please call [phone].  If you test positive for Covid 19 in the 2 weeks post procedure, please call and report this information to us.   ? ?If any biopsies were taken you will be contacted by phone or by letter within the next 1-3 weeks.  Please call us at [phone] if you have not heard about the biopsies in 3 weeks.  ? ? ?SIGNATURES/CONFIDENTIALITY: ?You and/or your care partner have signed paperwork which will be entered into your electronic medical record.  These signatures attest to the fact that that the information above on your After Visit Summary has been reviewed and is understood.  Full responsibility of the confidentiality of this discharge information lies with you and/or your care-partner.  ?

## 2020-07-28 NOTE — Progress Notes (Signed)
Report given to PACU, vss 

## 2020-07-28 NOTE — Progress Notes (Signed)
Called to room to assist during endoscopic procedure.  Patient ID and intended procedure confirmed with present staff. Received instructions for my participation in the procedure from the performing physician.  

## 2020-07-30 ENCOUNTER — Telehealth: Payer: Self-pay

## 2020-07-30 NOTE — Telephone Encounter (Signed)
  Follow up Call-  Call back number 07/28/2020  Post procedure Call Back phone  # [phone]  Permission to leave phone message Yes  Some recent data might be hidden     Patient questions:  Do you have a fever, pain , or abdominal swelling? No. Pain Score  0 *  Have you tolerated food without any problems? Yes.    Have you been able to return to your normal activities? Yes.    Do you have any questions about your discharge instructions: Diet   No. Medications  No. Follow up visit  No.  Do you have questions or concerns about your Care? No.  Actions: * If pain score is 4 or above: No action needed, pain <4.  Have you developed a fever since your procedure? no  2.   Have you had an respiratory symptoms (SOB or cough) since your procedure? no  3.   Have you tested positive for COVID 19 since your procedure no  4.   Have you had any family members/close contacts diagnosed with the COVID 19 since your procedure?  no   If yes to any of these questions please route to Joylene John, RN and Joella Prince, RN

## 2020-08-02 ENCOUNTER — Other Ambulatory Visit: Payer: Self-pay

## 2020-08-02 ENCOUNTER — Ambulatory Visit (INDEPENDENT_AMBULATORY_CARE_PROVIDER_SITE_OTHER): Payer: Medicare Other | Admitting: Dermatology

## 2020-08-02 ENCOUNTER — Encounter: Payer: Self-pay | Admitting: Dermatology

## 2020-08-02 DIAGNOSIS — F458 Other somatoform disorders: Secondary | ICD-10-CM

## 2020-08-02 DIAGNOSIS — Z1283 Encounter for screening for malignant neoplasm of skin: Secondary | ICD-10-CM | POA: Diagnosis not present

## 2020-08-02 DIAGNOSIS — Z85828 Personal history of other malignant neoplasm of skin: Secondary | ICD-10-CM

## 2020-08-02 DIAGNOSIS — L821 Other seborrheic keratosis: Secondary | ICD-10-CM

## 2020-08-02 MED ORDER — TRIAMCINOLONE ACETONIDE 0.1 % EX CREA: 1.0000 "application " | TOPICAL_CREAM | Freq: Every day | CUTANEOUS | 6 refills | Status: DC | PRN

## 2020-08-18 ENCOUNTER — Encounter: Payer: Self-pay | Admitting: Dermatology

## 2020-08-18 DIAGNOSIS — M5136 Other intervertebral disc degeneration, lumbar region: Secondary | ICD-10-CM | POA: Diagnosis not present

## 2020-08-18 DIAGNOSIS — M4726 Other spondylosis with radiculopathy, lumbar region: Secondary | ICD-10-CM | POA: Diagnosis not present

## 2020-08-18 DIAGNOSIS — M48061 Spinal stenosis, lumbar region without neurogenic claudication: Secondary | ICD-10-CM | POA: Diagnosis not present

## 2020-08-18 DIAGNOSIS — M4807 Spinal stenosis, lumbosacral region: Secondary | ICD-10-CM | POA: Diagnosis not present

## 2020-08-18 DIAGNOSIS — M5137 Other intervertebral disc degeneration, lumbosacral region: Secondary | ICD-10-CM | POA: Diagnosis not present

## 2020-08-18 DIAGNOSIS — M545 Low back pain, unspecified: Secondary | ICD-10-CM | POA: Diagnosis not present

## 2020-08-18 DIAGNOSIS — M47816 Spondylosis without myelopathy or radiculopathy, lumbar region: Secondary | ICD-10-CM | POA: Diagnosis not present

## 2020-08-18 NOTE — Progress Notes (Signed)
   Follow-Up Visit   Subjective  Harold [name]. is a 75 y.o. male who presents for the following: Annual Exam (Full body check no real concerns, patient has history of bcc=mohs).  General skin examination, itching on back. Location:  Duration:  Quality:  Associated Signs/Symptoms: Modifying Factors:  Severity:  Timing: Context:   Objective  Well appearing patient in no apparent distress; mood and affect are within normal limits. Full body skin exam.  No atypical pigmented lesions, no new or recurrent nonmelanoma skin cancer.  Multiple 4 to 81mm flattopped textured brown papules  Mid Back Recurring itch without rash or growth compatible with notalgia paresthetica    A full examination was performed including scalp, head, eyes, ears, nose, lips, neck, chest, axillae, abdomen, back, buttocks, bilateral upper extremities, bilateral lower extremities, hands, feet, fingers, toes, fingernails, and toenails. All findings within normal limits unless otherwise noted below.   Assessment & Plan    Screening for malignant neoplasm of skin  Yearly skin exam.  Encouraged to self examine twice annually.  Continue ultraviolet protection.  Seborrheic keratosis  Leave if stable  Neurogenic pruritus Mid Back  May 1 try an over-the-counter anti-itch lotion with the ingredient pramoxine.  If this fails he may get a prescription for triamcinolone and apply it daily after bathing for 3 weeks.  triamcinolone cream (KENALOG) 0.1 % - Mid Back Apply 1 application topically daily as needed.      I, Lavonna Monarch, MD, have reviewed all documentation for this visit.  The documentation on 08/18/20 for the exam, diagnosis, procedures, and orders are all accurate and complete.

## 2020-08-30 ENCOUNTER — Encounter: Payer: Self-pay | Admitting: Gastroenterology

## 2020-09-08 ENCOUNTER — Other Ambulatory Visit: Payer: Self-pay | Admitting: Internal Medicine

## 2020-09-10 ENCOUNTER — Other Ambulatory Visit: Payer: Self-pay | Admitting: Internal Medicine

## 2020-10-07 DIAGNOSIS — M48061 Spinal stenosis, lumbar region without neurogenic claudication: Secondary | ICD-10-CM | POA: Diagnosis not present

## 2020-10-07 DIAGNOSIS — M4807 Spinal stenosis, lumbosacral region: Secondary | ICD-10-CM | POA: Diagnosis not present

## 2020-10-07 DIAGNOSIS — M4726 Other spondylosis with radiculopathy, lumbar region: Secondary | ICD-10-CM | POA: Diagnosis not present

## 2020-10-07 DIAGNOSIS — M47816 Spondylosis without myelopathy or radiculopathy, lumbar region: Secondary | ICD-10-CM | POA: Diagnosis not present

## 2020-11-08 DIAGNOSIS — Z23 Encounter for immunization: Secondary | ICD-10-CM | POA: Diagnosis not present

## 2020-11-11 DIAGNOSIS — R1909 Other intra-abdominal and pelvic swelling, mass and lump: Secondary | ICD-10-CM | POA: Diagnosis not present

## 2020-11-11 DIAGNOSIS — M48061 Spinal stenosis, lumbar region without neurogenic claudication: Secondary | ICD-10-CM | POA: Diagnosis not present

## 2020-12-01 DIAGNOSIS — M48062 Spinal stenosis, lumbar region with neurogenic claudication: Secondary | ICD-10-CM | POA: Diagnosis not present

## 2020-12-01 DIAGNOSIS — G9519 Other vascular myelopathies: Secondary | ICD-10-CM | POA: Diagnosis not present

## 2020-12-02 ENCOUNTER — Other Ambulatory Visit: Payer: Self-pay

## 2020-12-02 ENCOUNTER — Other Ambulatory Visit: Payer: Medicare Other | Admitting: Internal Medicine

## 2020-12-02 DIAGNOSIS — Z0000 Encounter for general adult medical examination without abnormal findings: Secondary | ICD-10-CM

## 2020-12-02 DIAGNOSIS — E669 Obesity, unspecified: Secondary | ICD-10-CM

## 2020-12-02 DIAGNOSIS — I10 Essential (primary) hypertension: Secondary | ICD-10-CM | POA: Diagnosis not present

## 2020-12-02 DIAGNOSIS — N400 Enlarged prostate without lower urinary tract symptoms: Secondary | ICD-10-CM

## 2020-12-03 ENCOUNTER — Ambulatory Visit (INDEPENDENT_AMBULATORY_CARE_PROVIDER_SITE_OTHER): Payer: Medicare Other | Admitting: Internal Medicine

## 2020-12-03 ENCOUNTER — Other Ambulatory Visit: Payer: Self-pay

## 2020-12-03 ENCOUNTER — Encounter: Payer: Self-pay | Admitting: Internal Medicine

## 2020-12-03 VITALS — BP 112/62 | HR 64 | Temp 98.2°F | Ht 72.5 in | Wt 250.0 lb

## 2020-12-03 DIAGNOSIS — G609 Hereditary and idiopathic neuropathy, unspecified: Secondary | ICD-10-CM

## 2020-12-03 DIAGNOSIS — I10 Essential (primary) hypertension: Secondary | ICD-10-CM | POA: Diagnosis not present

## 2020-12-03 DIAGNOSIS — M48062 Spinal stenosis, lumbar region with neurogenic claudication: Secondary | ICD-10-CM | POA: Diagnosis not present

## 2020-12-03 DIAGNOSIS — N529 Male erectile dysfunction, unspecified: Secondary | ICD-10-CM

## 2020-12-03 DIAGNOSIS — Z0000 Encounter for general adult medical examination without abnormal findings: Secondary | ICD-10-CM | POA: Diagnosis not present

## 2020-12-03 DIAGNOSIS — B009 Herpesviral infection, unspecified: Secondary | ICD-10-CM | POA: Diagnosis not present

## 2020-12-03 DIAGNOSIS — Z86010 Personal history of colonic polyps: Secondary | ICD-10-CM | POA: Diagnosis not present

## 2020-12-03 DIAGNOSIS — N400 Enlarged prostate without lower urinary tract symptoms: Secondary | ICD-10-CM | POA: Diagnosis not present

## 2020-12-03 LAB — COMPLETE METABOLIC PANEL WITH GFR
AG Ratio: 1.9 (calc) (ref 1.0–2.5)
ALT: 21 U/L (ref 9–46)
AST: 16 U/L (ref 10–35)
Albumin: 4.4 g/dL (ref 3.6–5.1)
Alkaline phosphatase (APISO): 59 U/L (ref 35–144)
BUN: 14 mg/dL (ref 7–25)
CO2: 28 mmol/L (ref 20–32)
Calcium: 10.5 mg/dL — ABNORMAL HIGH (ref 8.6–10.3)
Chloride: 102 mmol/L (ref 98–110)
Creat: 0.91 mg/dL (ref 0.70–1.28)
Globulin: 2.3 g/dL (calc) (ref 1.9–3.7)
Glucose, Bld: 96 mg/dL (ref 65–99)
Potassium: 4.7 mmol/L (ref 3.5–5.3)
Sodium: 143 mmol/L (ref 135–146)
Total Bilirubin: 0.8 mg/dL (ref 0.2–1.2)
Total Protein: 6.7 g/dL (ref 6.1–8.1)
eGFR: 88 mL/min/{1.73_m2} (ref >=60)

## 2020-12-03 LAB — LIPID PANEL
Cholesterol: 179 mg/dL (ref <200)
HDL: 56 mg/dL (ref >=40)
LDL Cholesterol (Calc): 106 mg/dL (calc) — ABNORMAL HIGH
Non-HDL Cholesterol (Calc): 123 mg/dL (calc) (ref <130)
Total CHOL/HDL Ratio: 3.2 (calc) (ref <5.0)
Triglycerides: 80 mg/dL (ref <150)

## 2020-12-03 LAB — CBC WITH DIFFERENTIAL/PLATELET
Absolute Monocytes: 557 cells/uL (ref 200–950)
Basophils Absolute: 58 cells/uL (ref 0–200)
Basophils Relative: 0.9 %
Eosinophils Absolute: 13 cells/uL — ABNORMAL LOW (ref 15–500)
Eosinophils Relative: 0.2 %
HCT: 50.4 % — ABNORMAL HIGH (ref 38.5–50.0)
Hemoglobin: 17.1 g/dL (ref 13.2–17.1)
Lymphs Abs: 1914 cells/uL (ref 850–3900)
MCH: 32.3 pg (ref 27.0–33.0)
MCHC: 33.9 g/dL (ref 32.0–36.0)
MCV: 95.1 fL (ref 80.0–100.0)
MPV: 9.7 fL (ref 7.5–12.5)
Monocytes Relative: 8.7 %
Neutro Abs: 3859 cells/uL (ref 1500–7800)
Neutrophils Relative %: 60.3 %
Platelets: 247 10*3/uL (ref 140–400)
RBC: 5.3 10*6/uL (ref 4.20–5.80)
RDW: 11.9 % (ref 11.0–15.0)
Total Lymphocyte: 29.9 %
WBC: 6.4 10*3/uL (ref 3.8–10.8)

## 2020-12-03 LAB — PSA: PSA: 4.33 ng/mL — ABNORMAL HIGH

## 2020-12-03 LAB — POCT URINALYSIS DIPSTICK
Bilirubin, UA: NEGATIVE
Blood, UA: NEGATIVE
Glucose, UA: NEGATIVE
Ketones, UA: NEGATIVE
Leukocytes, UA: NEGATIVE
Nitrite, UA: NEGATIVE
Protein, UA: NEGATIVE
Spec Grav, UA: 1.01 (ref 1.010–1.025)
Urobilinogen, UA: 0.2 E.U./dL
pH, UA: 5 (ref 5.0–8.0)

## 2020-12-03 NOTE — Progress Notes (Signed)
Annual Wellness Visit     Patient: Harold Mcfadden., Male    DOB: May 31, 1945, 75 y.o.   MRN: [id number]  Subjective    Harold P Rodric Punch. is a 75 y.o. male who presents today for his Annual Wellness Visit.  HPI 75 year old Male seen for Medicare wellness and health maintenance exam with evlauation  of medical issues. Cannot walk walk more than 1/4 mile without discomfort. Scheduled for lumbar decompression at [facility] in November.  PSA is elevated at 4.33 and was 3.71 one year ago. Results mailed to Urologist Dr. Estill Dooms in [place] today.  Lipid panel is basically normal except for LDL of 106.Calcium slight elevated at 10.5 and can be followed. Lipid panel normal except slight elevation of LDL of 106.  He has a history of hypertension, hyperlipidemia, anxiety.  Longstanding history of numbness in his feet for which she has used Neurontin cream without a lot of relief.  B12 level has been checked previously.  He takes Valtrex daily for at bedtime 1 infection.  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  History of nummular dermatitis Dr. Denna Haggard.  History of basal cell carcinoma.  History of bilateral carpal tunnel surgery 25-Apr-2016 by Dr. Amedeo Plenty.  History of colonoscopy done 2012-04-25 by Dr. Olevia Perches with tubular adenoma being removed from the ascending colon.  Had repeat colonoscopy in 2020/04/25 with 3 tubular adenomas being removed and one hyperplastic polyp.  Repeat study recommended in 3 years.  Past medical history: He had Hepatitis in 1963-04-26.  Fractured nose at age 41.  Tonsillectomy 1952.  Nasal septum deviation surgery February 2004.  Cervical disc surgery April 2007.  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  He resides in [place].  He is married.  A graduate of [employer].  Does not smoke.  Social alcohol consumption.  He used to smoke but quit around 1997/04/25.  Son deceased in 2016/04/25.  He has homes in [place] and  in [place].  He enjoys golf.  He went to [place] for adults at [place].  He has a boat and does some fishing.  Family history: Father died at age 19 with MI with history of diabetes and hypertension.  Mother died of complications of Alzheimer's disease.  1 brother in good health.  1 sister with history of nephrotic syndrome.  History of BPH seen by Dr.Eskew, Urologist.  He has been treated with Cialis for erectile dysfunction and Flomax for BPH.    Patient Care Team: Harold Showers, MD as PCP - General (Internal Medicine) Lavonna Monarch, MD as Consulting Physician (Dermatology)  Review of Systems no new complaints other than back pain   Objective    Vitals: Ht 6' 0.5" (1.842 m)   Wt 250 lb (113.4 kg)   BMI 33.44 kg/m   lost 3 pounds from last year  Physical Exam  Skin: Warm and dry.  Nodes none.  TMs clear.  Neck supple without JVD thyromegaly or carotid bruits.  Chest is clear to auscultation.  Cardiac exam: Regular rate and rhythm.  Abdomen is soft nondistended without hepatosplenomegaly masses or tenderness.  Neuro is intact without focal deficits.  IElby Showers, MD, have reviewed all documentation for this visit. The documentation on 12/04/20 for the exam, diagnosis, procedures, and orders are all accurate and complete.    Most recent functional status assessment: In your  present state of health, do you have any difficulty performing the following activities: 12/03/2020  Hearing? N  Vision? N  Difficulty concentrating or making decisions? N  Walking or climbing stairs? N  Dressing or bathing? N  Doing errands, shopping? N  Preparing Food and eating ? N  Using the Toilet? N  In the past six months, have you accidently leaked urine? N  Do you have problems with loss of bowel control? N  Managing your Medications? N  Managing your Finances? N  Housekeeping or managing your Housekeeping? N  Some recent data might be hidden   Most recent  fall risk assessment: Fall Risk  12/03/2020  Falls in the past year? 0  Comment -  Number falls in past yr: 0  Injury with Fall? 0  Risk for fall due to : No Fall Risks  Follow up Falls evaluation completed    Most recent depression screenings: PHQ 2/9 Scores 12/03/2020 12/01/2019  PHQ - 2 Score 0 0   Most recent cognitive screening: 6CIT Screen 12/03/2020  What Year? 0 points  What month? 0 points  What time? 0 points  Count back from 20 0 points  Months in reverse 0 points  Repeat phrase 0 points  Total Score 0       Assessment & Plan     Annual wellness visit done today including the all of the following: Reviewed patient's Family Medical History Reviewed and updated list of patient's medical providers Assessment of cognitive impairment was done Assessed patient's functional ability Established a written schedule for health screening [place] Completed and Reviewed  Discussed health benefits of physical activity, and encouraged him to engage in regular exercise appropriate for his age and condition.        Spinal stenosis-to have surgery at [facility] in November  Elevated PSA-result medial to his urologist who is now located in [place].  Patient is to check in with him in the next few months.  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  Could be a lab error  Very mild elevation of LDL at 106.  2 years ago LDL was 126.  He is not on statin medication.  Consider coronary calcium scoring in the next few months after he recovers from back surgery.  This will give [place] an idea of his degree of atherosclerosis.  Plan: Return in 1 year or as needed.   Angus Seller, CMA

## 2020-12-04 NOTE — Patient Instructions (Signed)
It was a pleasure to see you today.  Please check in with urologist regarding mild elevation of PSA.  Continue current medications.  Blood pressure is stable.  Return in 6 to 12 months or as needed.

## 2020-12-09 ENCOUNTER — Other Ambulatory Visit: Payer: Self-pay | Admitting: Internal Medicine

## 2020-12-19 DIAGNOSIS — Z01818 Encounter for other preprocedural examination: Secondary | ICD-10-CM | POA: Diagnosis not present

## 2020-12-19 DIAGNOSIS — Z20822 Contact with and (suspected) exposure to covid-19: Secondary | ICD-10-CM | POA: Diagnosis not present

## 2020-12-20 DIAGNOSIS — G8929 Other chronic pain: Secondary | ICD-10-CM | POA: Diagnosis not present

## 2020-12-20 DIAGNOSIS — M47816 Spondylosis without myelopathy or radiculopathy, lumbar region: Secondary | ICD-10-CM | POA: Diagnosis not present

## 2020-12-20 DIAGNOSIS — I10 Essential (primary) hypertension: Secondary | ICD-10-CM | POA: Diagnosis not present

## 2020-12-20 DIAGNOSIS — Z79899 Other long term (current) drug therapy: Secondary | ICD-10-CM | POA: Diagnosis not present

## 2020-12-20 DIAGNOSIS — Z85828 Personal history of other malignant neoplasm of skin: Secondary | ICD-10-CM | POA: Diagnosis not present

## 2020-12-20 DIAGNOSIS — M1990 Unspecified osteoarthritis, unspecified site: Secondary | ICD-10-CM | POA: Diagnosis not present

## 2020-12-20 DIAGNOSIS — M48062 Spinal stenosis, lumbar region with neurogenic claudication: Secondary | ICD-10-CM | POA: Diagnosis not present

## 2020-12-20 DIAGNOSIS — M4807 Spinal stenosis, lumbosacral region: Secondary | ICD-10-CM | POA: Diagnosis not present

## 2020-12-21 DIAGNOSIS — I10 Essential (primary) hypertension: Secondary | ICD-10-CM | POA: Diagnosis not present

## 2020-12-21 DIAGNOSIS — G8929 Other chronic pain: Secondary | ICD-10-CM | POA: Diagnosis not present

## 2020-12-21 DIAGNOSIS — M47816 Spondylosis without myelopathy or radiculopathy, lumbar region: Secondary | ICD-10-CM | POA: Diagnosis not present

## 2020-12-21 DIAGNOSIS — M1990 Unspecified osteoarthritis, unspecified site: Secondary | ICD-10-CM | POA: Diagnosis not present

## 2020-12-21 DIAGNOSIS — M48062 Spinal stenosis, lumbar region with neurogenic claudication: Secondary | ICD-10-CM | POA: Diagnosis not present

## 2020-12-21 DIAGNOSIS — M4807 Spinal stenosis, lumbosacral region: Secondary | ICD-10-CM | POA: Diagnosis not present

## 2021-01-02 IMAGING — MR MRI LUMBAR SPINE WITHOUT CONTRAST
4 of 5 series · 25 of 48 positions shown · non-contrast
Comparison: None.

CLINICAL DATA: Chronic low back pain with bilateral leg weakness.

EXAM:
MRI LUMBAR SPINE WITHOUT CONTRAST
TECHNIQUE: Multiplanar, multisequence MR imaging of the lumbar spine was
performed. No intravenous contrast was administered.

[Series 3: T2 post-contrast · sagittal · 4.0mm · 0.55mm/px · 6 of 13 slices shown]
[im 1/13]
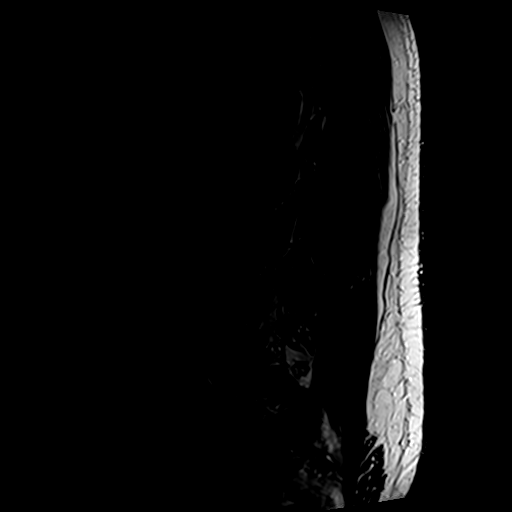
[im 3/13]
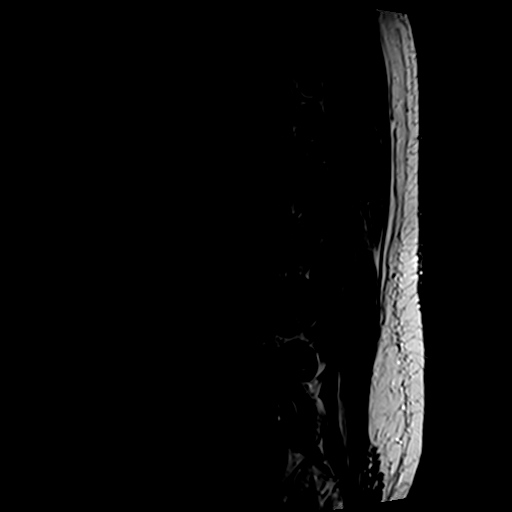
[im 5/13]
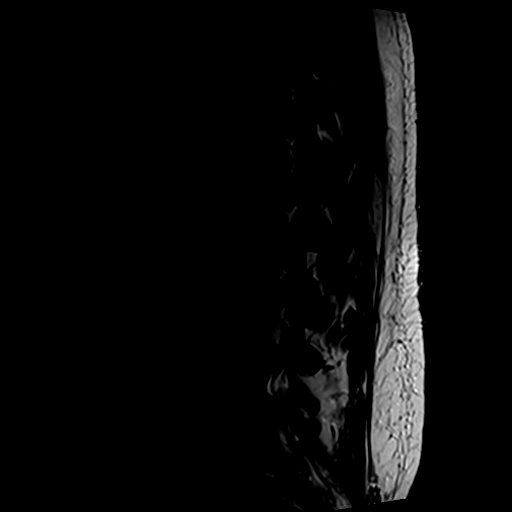
[im 8/13]
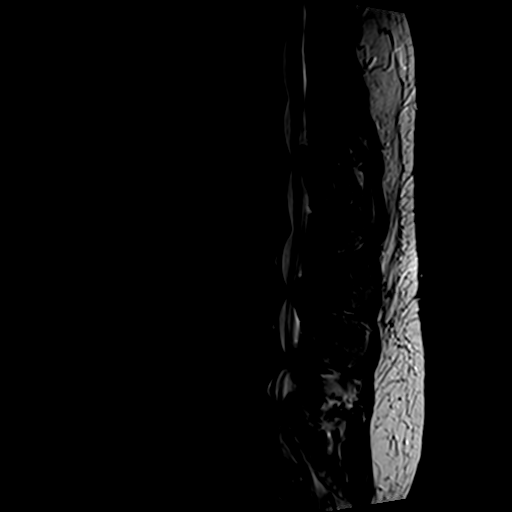
[im 10/13]
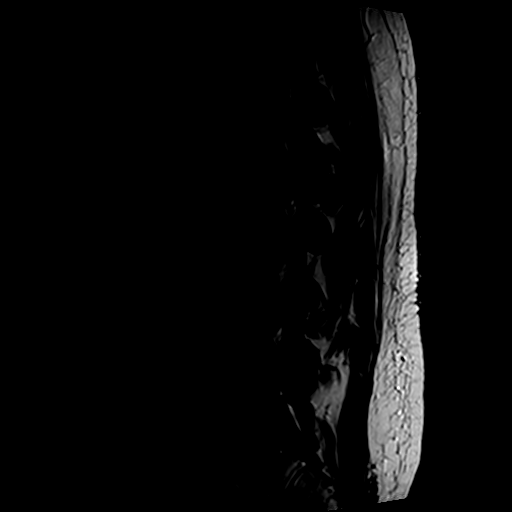
[im 13/13]
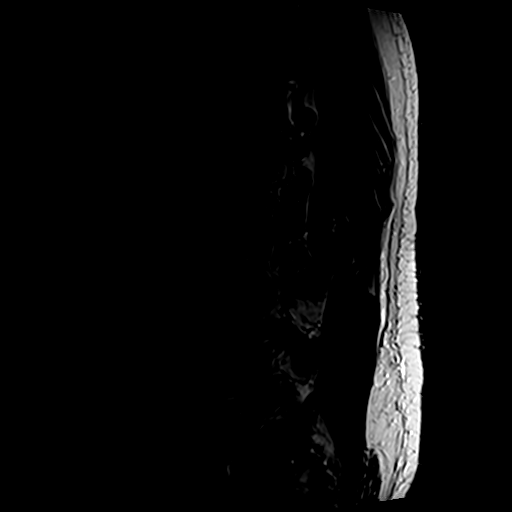

[Series 4: T1 · sagittal · 4.0mm · 0.55mm/px · 6 of 13 slices shown (1 of 2)]
[im 1/13]
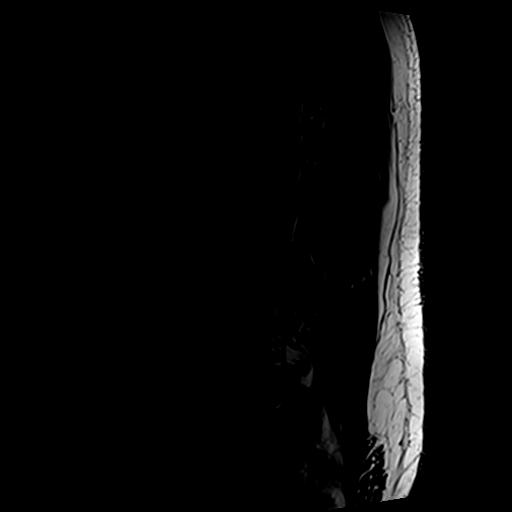
[im 3/13]
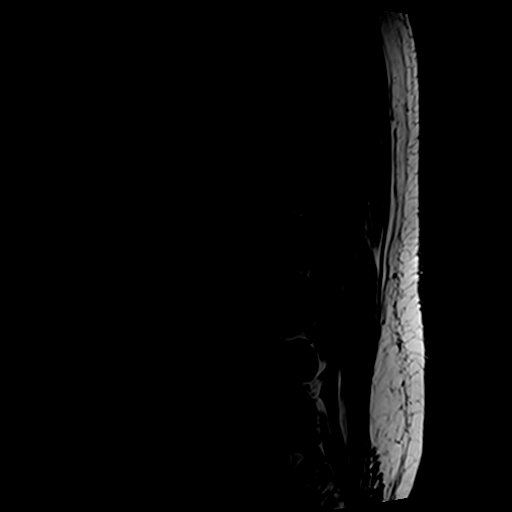
[im 5/13]
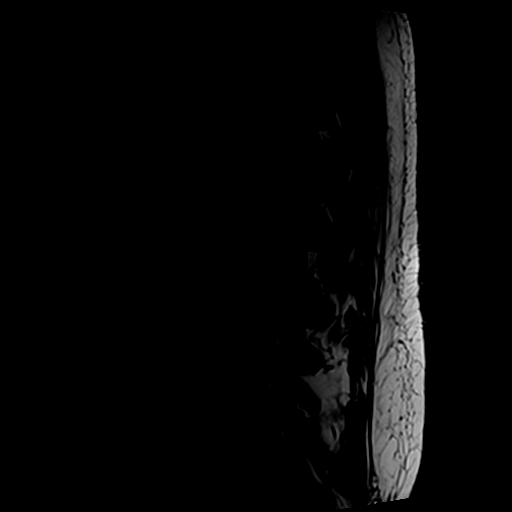
[im 8/13]
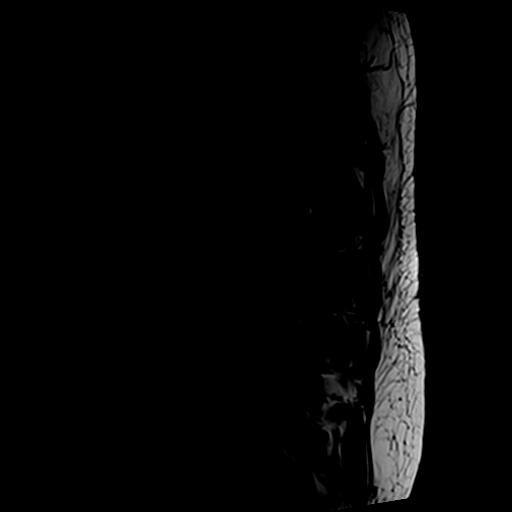
[im 10/13]
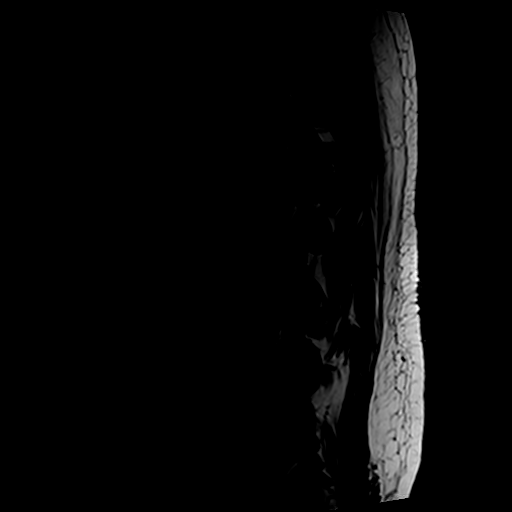
[im 13/13]
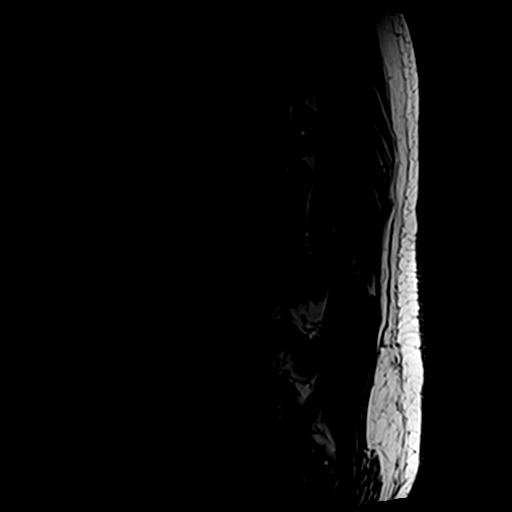

[Series 5: T1 · axial · 4.0mm · 0.37mm/px · z∈[-102,+67]mm · 4 of 35 slices shown (2 of 2)]
[im 1/35]
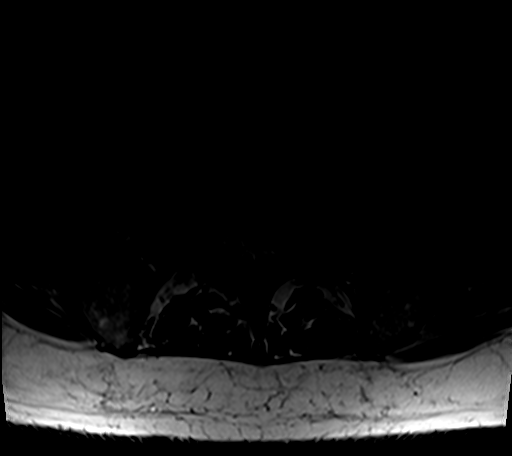
[im 5/35]
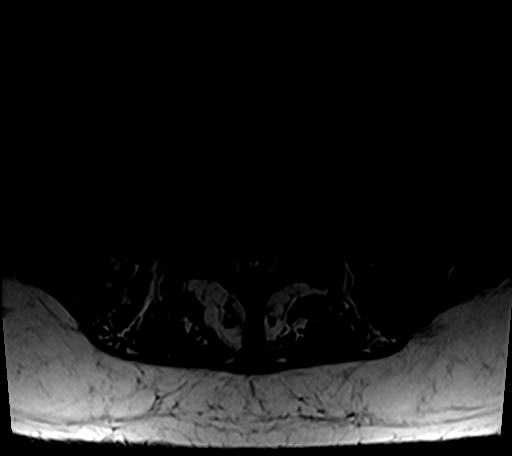
[im 18/35]
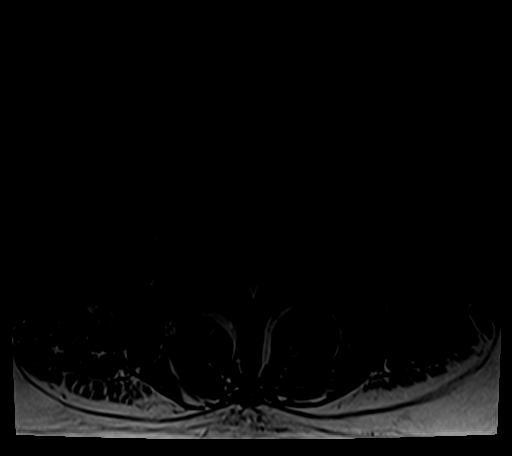
[im 30/35]
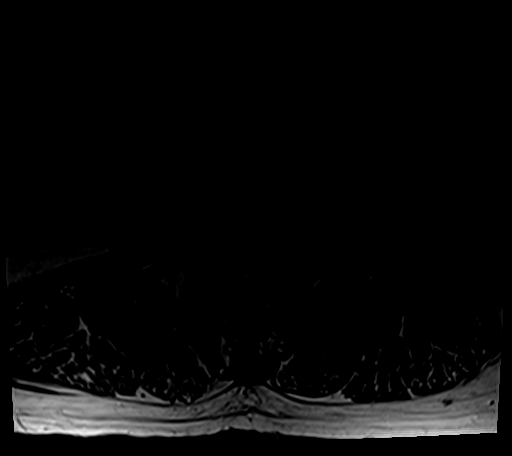

[Series 6: T2 · axial · 4.0mm · 0.74mm/px · z∈[-102,+109]mm · 9 of 35 slices shown]
[im 1/35]
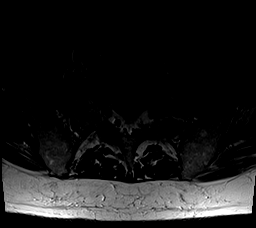
[im 5/35]
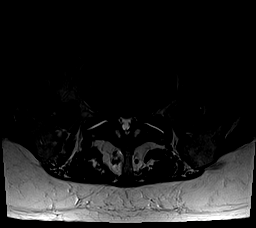
[im 10/35]
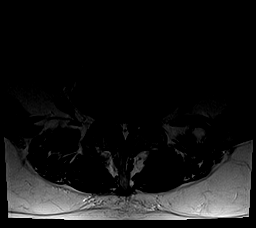
[im 15/35]
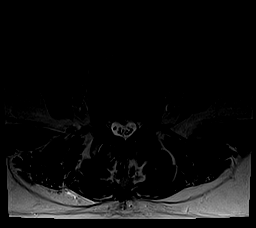
[im 18/35]
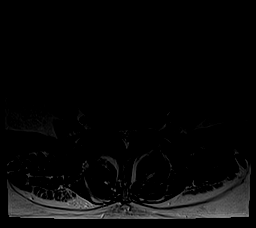
[im 20/35]
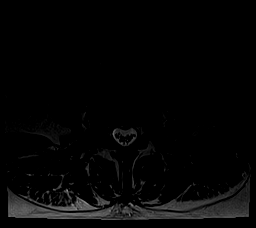
[im 25/35]
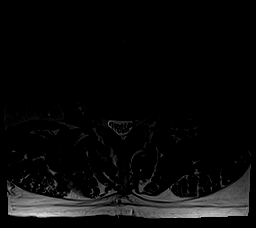
[im 30/35]
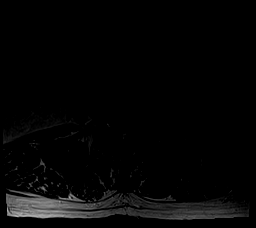
[im 35/35]
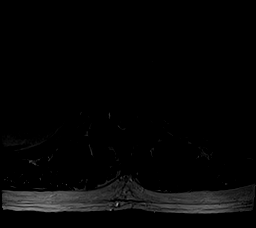

[25 of 48 positions shown; findings below may reference images not displayed]

FINDINGS: Segmentation:  Standard.

Alignment: Straightening of the normal lumbar lordosis. Trace
retrolisthesis at L2-L3.

Vertebrae: No fracture, evidence of discitis, or bone lesion.
Schmorl's node involving the L5 inferior endplate.

Conus medullaris and cauda equina: Conus extends to the L1 level.
Conus and cauda equina appear normal.

Paraspinal and other soft tissues: Negative.

Disc levels:

Congenital spinal canal narrowing due to shortened pedicles.

T12-L1: Mild disc bulging and bilateral facet arthropathy. No
stenosis.

L1-L2: Mild disc bulging with small central and left paracentral
annular fissure. Moderate bilateral facet arthropathy. Moderate
spinal canal stenosis. No neuroforaminal stenosis.

L2-L3: Mild disc bulging. Mild spinal canal and left lateral recess
stenosis. No neuroforaminal stenosis.

L3-L4: Mild disc bulging. Moderate right and mild left facet
arthropathy. Moderate to severe spinal canal stenosis. Moderate left
and mild right lateral recess stenosis. No neuroforaminal stenosis.

L4-L5: Mild disc bulging and moderate right facet arthropathy.
Moderate to severe spinal canal stenosis. Severe bilateral lateral
recess stenosis. Mild right neuroforaminal stenosis.

L5-S1: Small broad-based posterior disc protrusion and
mild-to-moderate bilateral facet arthropathy. Moderate bilateral
lateral recess and neuroforaminal stenosis. No spinal canal
stenosis.
IMPRESSION: 1. Multilevel lumbar spondylosis as described above, superimposed on
congenital spinal canal narrowing. Moderate to severe spinal canal
stenosis at L3-L4 and L4-L5.
2. Severe bilateral lateral recess stenosis at L4-L5. Moderate
bilateral lateral recess and neuroforaminal stenosis at L5-S1.

## 2021-01-05 DIAGNOSIS — Z4889 Encounter for other specified surgical aftercare: Secondary | ICD-10-CM | POA: Diagnosis not present

## 2021-01-17 DIAGNOSIS — R972 Elevated prostate specific antigen [PSA]: Secondary | ICD-10-CM | POA: Diagnosis not present

## 2021-01-17 DIAGNOSIS — N401 Enlarged prostate with lower urinary tract symptoms: Secondary | ICD-10-CM | POA: Diagnosis not present

## 2021-01-17 DIAGNOSIS — N138 Other obstructive and reflux uropathy: Secondary | ICD-10-CM | POA: Diagnosis not present

## 2021-01-18 DIAGNOSIS — D481 Neoplasm of uncertain behavior of connective and other soft tissue: Secondary | ICD-10-CM | POA: Diagnosis not present

## 2021-02-23 DIAGNOSIS — M25652 Stiffness of left hip, not elsewhere classified: Secondary | ICD-10-CM | POA: Diagnosis not present

## 2021-02-23 DIAGNOSIS — M25651 Stiffness of right hip, not elsewhere classified: Secondary | ICD-10-CM | POA: Diagnosis not present

## 2021-02-23 DIAGNOSIS — M545 Low back pain, unspecified: Secondary | ICD-10-CM | POA: Diagnosis not present

## 2021-02-23 DIAGNOSIS — Z4789 Encounter for other orthopedic aftercare: Secondary | ICD-10-CM | POA: Diagnosis not present

## 2021-02-23 DIAGNOSIS — R29898 Other symptoms and signs involving the musculoskeletal system: Secondary | ICD-10-CM | POA: Diagnosis not present

## 2021-03-07 DIAGNOSIS — M25652 Stiffness of left hip, not elsewhere classified: Secondary | ICD-10-CM | POA: Diagnosis not present

## 2021-03-07 DIAGNOSIS — M545 Low back pain, unspecified: Secondary | ICD-10-CM | POA: Diagnosis not present

## 2021-03-07 DIAGNOSIS — M25651 Stiffness of right hip, not elsewhere classified: Secondary | ICD-10-CM | POA: Diagnosis not present

## 2021-03-07 DIAGNOSIS — Z4789 Encounter for other orthopedic aftercare: Secondary | ICD-10-CM | POA: Diagnosis not present

## 2021-03-07 DIAGNOSIS — R29898 Other symptoms and signs involving the musculoskeletal system: Secondary | ICD-10-CM | POA: Diagnosis not present

## 2021-03-10 DIAGNOSIS — Z4789 Encounter for other orthopedic aftercare: Secondary | ICD-10-CM | POA: Diagnosis not present

## 2021-03-10 DIAGNOSIS — M25652 Stiffness of left hip, not elsewhere classified: Secondary | ICD-10-CM | POA: Diagnosis not present

## 2021-03-10 DIAGNOSIS — M545 Low back pain, unspecified: Secondary | ICD-10-CM | POA: Diagnosis not present

## 2021-03-10 DIAGNOSIS — R29898 Other symptoms and signs involving the musculoskeletal system: Secondary | ICD-10-CM | POA: Diagnosis not present

## 2021-03-10 DIAGNOSIS — M25651 Stiffness of right hip, not elsewhere classified: Secondary | ICD-10-CM | POA: Diagnosis not present

## 2021-03-15 DIAGNOSIS — M25651 Stiffness of right hip, not elsewhere classified: Secondary | ICD-10-CM | POA: Diagnosis not present

## 2021-03-15 DIAGNOSIS — M25652 Stiffness of left hip, not elsewhere classified: Secondary | ICD-10-CM | POA: Diagnosis not present

## 2021-03-15 DIAGNOSIS — R29898 Other symptoms and signs involving the musculoskeletal system: Secondary | ICD-10-CM | POA: Diagnosis not present

## 2021-03-15 DIAGNOSIS — Z4789 Encounter for other orthopedic aftercare: Secondary | ICD-10-CM | POA: Diagnosis not present

## 2021-03-15 DIAGNOSIS — M545 Low back pain, unspecified: Secondary | ICD-10-CM | POA: Diagnosis not present

## 2021-03-23 DIAGNOSIS — M25652 Stiffness of left hip, not elsewhere classified: Secondary | ICD-10-CM | POA: Diagnosis not present

## 2021-03-23 DIAGNOSIS — I10 Essential (primary) hypertension: Secondary | ICD-10-CM | POA: Diagnosis not present

## 2021-03-23 DIAGNOSIS — Z4789 Encounter for other orthopedic aftercare: Secondary | ICD-10-CM | POA: Diagnosis not present

## 2021-03-23 DIAGNOSIS — M545 Low back pain, unspecified: Secondary | ICD-10-CM | POA: Diagnosis not present

## 2021-03-23 DIAGNOSIS — R29898 Other symptoms and signs involving the musculoskeletal system: Secondary | ICD-10-CM | POA: Diagnosis not present

## 2021-03-23 DIAGNOSIS — G43109 Migraine with aura, not intractable, without status migrainosus: Secondary | ICD-10-CM | POA: Diagnosis not present

## 2021-03-23 DIAGNOSIS — H16223 Keratoconjunctivitis sicca, not specified as Sjogren's, bilateral: Secondary | ICD-10-CM | POA: Diagnosis not present

## 2021-03-23 DIAGNOSIS — H26492 Other secondary cataract, left eye: Secondary | ICD-10-CM | POA: Diagnosis not present

## 2021-03-23 DIAGNOSIS — H524 Presbyopia: Secondary | ICD-10-CM | POA: Diagnosis not present

## 2021-03-23 DIAGNOSIS — H35033 Hypertensive retinopathy, bilateral: Secondary | ICD-10-CM | POA: Diagnosis not present

## 2021-03-23 DIAGNOSIS — M25651 Stiffness of right hip, not elsewhere classified: Secondary | ICD-10-CM | POA: Diagnosis not present

## 2021-07-15 LAB — BASIC METABOLIC PANEL
BUN: 24 — AB (ref 4–21)
CO2: 24 — AB (ref 13–22)
Chloride: 103 (ref 99–108)
Creatinine: 0.9 (ref 0.6–1.3)
Glucose: 102
Potassium: 4.7 mEq/L (ref 3.5–5.1)
Sodium: 141 (ref 137–147)

## 2021-07-15 LAB — COMPREHENSIVE METABOLIC PANEL WITH GFR
Calcium: 9.5 (ref 8.7–10.7)
eGFR: 90

## 2021-07-15 LAB — CBC AND DIFFERENTIAL
HCT: 50 (ref 41–53)
Hemoglobin: 16.8 (ref 13.5–17.5)
WBC: 5.6

## 2021-07-15 LAB — CBC: RBC: 5.37 — AB (ref 3.87–5.11)

## 2021-07-15 LAB — HEMOGLOBIN A1C: Hemoglobin A1C: 5

## 2021-07-15 LAB — TSH: TSH: 1.91 (ref 0.41–5.90)

## 2021-07-15 LAB — VITAMIN B12: Vitamin B-12: 325

## 2021-07-16 LAB — LIPID PANEL
Cholesterol: 146 (ref 0–200)
HDL: 51 (ref 35–70)
LDL Cholesterol: 84
Triglycerides: 79 (ref 40–160)

## 2021-08-30 ENCOUNTER — Ambulatory Visit (INDEPENDENT_AMBULATORY_CARE_PROVIDER_SITE_OTHER): Payer: Medicare Other | Admitting: Dermatology

## 2021-08-30 DIAGNOSIS — C44222 Squamous cell carcinoma of skin of right ear and external auricular canal: Secondary | ICD-10-CM | POA: Diagnosis not present

## 2021-08-30 DIAGNOSIS — L570 Actinic keratosis: Secondary | ICD-10-CM

## 2021-08-30 DIAGNOSIS — Z85828 Personal history of other malignant neoplasm of skin: Secondary | ICD-10-CM

## 2021-08-30 DIAGNOSIS — Z1283 Encounter for screening for malignant neoplasm of skin: Secondary | ICD-10-CM

## 2021-08-30 DIAGNOSIS — D1801 Hemangioma of skin and subcutaneous tissue: Secondary | ICD-10-CM | POA: Diagnosis not present

## 2021-08-30 DIAGNOSIS — D0421 Carcinoma in situ of skin of right ear and external auricular canal: Secondary | ICD-10-CM

## 2021-08-30 DIAGNOSIS — Z86018 Personal history of other benign neoplasm: Secondary | ICD-10-CM

## 2021-08-30 DIAGNOSIS — D485 Neoplasm of uncertain behavior of skin: Secondary | ICD-10-CM

## 2021-08-30 NOTE — Patient Instructions (Signed)

## 2021-09-19 DIAGNOSIS — M25862 Other specified joint disorders, left knee: Secondary | ICD-10-CM | POA: Diagnosis not present

## 2021-09-19 DIAGNOSIS — M25861 Other specified joint disorders, right knee: Secondary | ICD-10-CM | POA: Diagnosis not present

## 2021-09-19 DIAGNOSIS — M25461 Effusion, right knee: Secondary | ICD-10-CM | POA: Diagnosis not present

## 2021-09-19 DIAGNOSIS — M1611 Unilateral primary osteoarthritis, right hip: Secondary | ICD-10-CM | POA: Diagnosis not present

## 2021-09-19 DIAGNOSIS — M25561 Pain in right knee: Secondary | ICD-10-CM | POA: Diagnosis not present

## 2021-09-19 DIAGNOSIS — M1711 Unilateral primary osteoarthritis, right knee: Secondary | ICD-10-CM | POA: Diagnosis not present

## 2021-09-19 DIAGNOSIS — M1612 Unilateral primary osteoarthritis, left hip: Secondary | ICD-10-CM | POA: Diagnosis not present

## 2021-09-19 DIAGNOSIS — G8929 Other chronic pain: Secondary | ICD-10-CM | POA: Diagnosis not present

## 2021-09-21 ENCOUNTER — Encounter: Payer: Self-pay | Admitting: Dermatology

## 2021-09-21 NOTE — Progress Notes (Signed)
   Follow-Up Visit   Subjective  Harold [name]. is a 76 y.o. male who presents for the following: Annual Exam (Has a lesion under right ear. LN2 last visit. It is sore per patient. History of atypical mole and non mole skin cancer. ).  General skin check, crust lower right ear did not clear with freezing, several crusts on forehead /scalp Location:  Duration:  Quality:  Associated Signs/Symptoms: Modifying Factors:  Severity:  Timing: Context:   Objective  Well appearing patient in no apparent distress; mood and affect are within normal limits. Waist up skin exam.  No atypical pigmented lesions (all checked with dermoscopy).  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  Several 1 mm smooth red dermal papules  Head - Anterior (Face) (3)  3 mm gritty pink crusts  Right Anterior Lobule 6 Millimeter waxy crust         All skin waist up examined.   Assessment & Plan    Screening for malignant neoplasm of skin  Yearly skin exams.  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  Skin / nail biopsy Type of biopsy: tangential   Informed consent: discussed and consent obtained   Timeout: patient name, date of birth, surgical site, and procedure verified   Anesthesia: the lesion was anesthetized in a standard fashion   Anesthetic:  1% lidocaine w/ epinephrine 1-100,000 local infiltration Instrument  used: flexible razor blade   Hemostasis achieved with: ferric subsulfate   Outcome: patient tolerated procedure well   Post-procedure details: wound care instructions given    Destruction of lesion Complexity: simple   Destruction method: electrodesiccation and curettage   Informed consent: discussed and consent obtained   Timeout:  patient name, date of birth, surgical site, and procedure verified Anesthesia: the lesion was anesthetized in a standard fashion   Anesthetic:  1% lidocaine w/ epinephrine 1-100,000 local infiltration Curettage performed in three different directions: Yes   Curettage cycles:  3 Lesion length (cm):  0.6 Lesion width (cm):  0.6 Margin per side (cm):  0 Final wound size (cm):  0.6 Hemostasis achieved with:  aluminum chloride Outcome: patient tolerated procedure well with no complications   Post-procedure details: wound care instructions given    Specimen 1 - Surgical pathology Differential Diagnosis: scc vs bcc  Check Margins: No  After shave biopsy this lesion which was actually just below the right ear lobule was treated with curettage plus cautery.      I, Lavonna Monarch, MD, have reviewed all documentation for this visit.  The documentation on 09/21/21 for the exam, diagnosis, procedures, and orders are all accurate and complete.

## 2021-10-04 ENCOUNTER — Other Ambulatory Visit: Payer: Self-pay | Admitting: Internal Medicine

## 2021-11-29 DIAGNOSIS — Z23 Encounter for immunization: Secondary | ICD-10-CM | POA: Diagnosis not present

## 2021-12-22 ENCOUNTER — Telehealth: Payer: Self-pay | Admitting: Internal Medicine

## 2021-12-22 ENCOUNTER — Encounter: Payer: Self-pay | Admitting: Internal Medicine

## 2021-12-22 NOTE — Telephone Encounter (Signed)
Gardiner Rhyme [phone]  Harold [name] called to say he is had a dizzy spell this morning. After talking with him I discovered he had gone off his hydrochlorothiazide (HYDRODIURIL) 25 MG tablet for awhile because he said it was causing him to have dizzy spells. Then he went back on it a week ago and he also started taking Muscinex DM two days ago for cough and congestion and this morning he went to play Pickle ball and got dizzy, blood pressure was 106/56, after coming home and resting awhile blood pressure went back to 120/80. I have ask him to start a log of his blood pressure of at least twice a day, with taking his medications as prescribed. He did say the Muscinex is helping with the cough and congestion it is better but not gone yet.

## 2021-12-23 ENCOUNTER — Telehealth (INDEPENDENT_AMBULATORY_CARE_PROVIDER_SITE_OTHER): Payer: Medicare Other | Admitting: Internal Medicine

## 2021-12-23 DIAGNOSIS — U071 COVID-19: Secondary | ICD-10-CM | POA: Diagnosis not present

## 2021-12-23 MED ORDER — NIRMATRELVIR/RITONAVIR (PAXLOVID)TABLET: 3.0000 | ORAL_TABLET | Freq: Two times a day (BID) | ORAL | 0 refills | Status: AC

## 2021-12-23 MED ORDER — BENZONATATE 100 MG PO CAPS: 100.0000 mg | ORAL_CAPSULE | Freq: Three times a day (TID) | ORAL | 0 refills | Status: DC | PRN

## 2021-12-23 NOTE — Telephone Encounter (Signed)
Harold [name] has called back with COVID test results as positive

## 2021-12-23 NOTE — Telephone Encounter (Signed)
Boomer called me back, he is not feeling very well. He is having body aches, could not sleep last night , still coughing. He is going to do COVID test and call me back.

## 2021-12-23 NOTE — Telephone Encounter (Signed)
Should I schedule him to come in to be seen?

## 2021-12-23 NOTE — Progress Notes (Signed)
   Subjective:    Patient ID: Harold [name]., male    DOB: 08/26/1945, 77 y.o.   MRN: [id number]  HPI 76 year old Male seen today via interactive audio and video telecommunications due to the coronavirus pandemic.  He is identified using 2 identifiers as Harold P. Vernia Buff., a patient in this practice.  He is at his home and I am at my office.  He is agreeable to visit in this format today.  Patient says that he had an episode of vertigo on November 16.  He had discontinued HCTZ because he felt it was causing some dizzy spells.  He started taking it again in and was also taking Mucinex for cough and congestion.  He went to play pickle ball on November 16 and was dizzy.  His blood pressure was a bit low but returned to normal after resting.  He called back on November 17 saying he felt poorly.  He was having myalgias, insomnia and coughing.  He did a COVID test that was positive.  Therefore we agreed on a video visit.  His wife is currently asymptomatic.  Past medical history: History of BPH, essential hypertension, peripheral neuropathy, adenomatous colon polyps and spinal stenosis with history of surgery for spinal stenosis at [facility] in November 2022.  He had an L3-S1 laminectomy with foraminotomies.  Review of Systems denies vomiting or shaking chills     Objective:   Physical Exam He is seen virtually and looks a bit fatigued but is able to give a clear concise history.  Does not appear to be tachypneic when he is speaking.  Not heard to be coughing at the present time.       Assessment & Plan:   Acute COVID-19 virus infection  History of essential hypertension which is stable  History of BPH  Plan: Patient needs to quarantine at home for 5 days.  His wife is already been exposed and she will need to monitor for symptoms of COVID-19.  Have prescribed Tessalon Perles 100 mg to take by mouth up to 3 times daily as needed for cough.  He is agreeable to take Paxlovid.  His  GFR is normal and he will be given regular strength Paxlovid.  He will call if symptoms not improving within 48 hours or sooner if worse.  He will monitor pulse oximetry and walk some to prevent atelectasis.

## 2021-12-23 NOTE — Telephone Encounter (Signed)
LVM to CB.

## 2021-12-31 ENCOUNTER — Encounter: Payer: Self-pay | Admitting: Internal Medicine

## 2021-12-31 NOTE — Patient Instructions (Signed)
You have been diagnosed with COVID-19 virus infection.  Please monitor pulse oximetry at home and walk some around her home to prevent atelectasis.  You must quarantine at home for 5 days.  Please stay well-hydrated and try to eat something.  Please call if you develop nausea and vomiting.  We can provide medication for that if you need it.  You may take Tylenol for fever or myalgias.  We are sending in Paxlovid regular strength.  Please follow directions carefully.  Also Tessalon Perles 100 mg up to 3 times daily as needed for cough.

## 2022-01-20 ENCOUNTER — Other Ambulatory Visit: Payer: Medicare Other

## 2022-01-20 DIAGNOSIS — Z13220 Encounter for screening for lipoid disorders: Secondary | ICD-10-CM | POA: Diagnosis not present

## 2022-01-20 DIAGNOSIS — N400 Enlarged prostate without lower urinary tract symptoms: Secondary | ICD-10-CM

## 2022-01-20 DIAGNOSIS — I10 Essential (primary) hypertension: Secondary | ICD-10-CM

## 2022-01-21 LAB — CBC WITH DIFFERENTIAL/PLATELET
Absolute Monocytes: 529 cells/uL (ref 200–950)
Basophils Absolute: 32 cells/uL (ref 0–200)
Basophils Relative: 0.5 %
Eosinophils Absolute: 170 cells/uL (ref 15–500)
Eosinophils Relative: 2.7 %
HCT: 47.4 % (ref 38.5–50.0)
Hemoglobin: 16.3 g/dL (ref 13.2–17.1)
Lymphs Abs: 1600 cells/uL (ref 850–3900)
MCH: 31.5 pg (ref 27.0–33.0)
MCHC: 34.4 g/dL (ref 32.0–36.0)
MCV: 91.5 fL (ref 80.0–100.0)
MPV: 9.7 fL (ref 7.5–12.5)
Monocytes Relative: 8.4 %
Neutro Abs: 3969 cells/uL (ref 1500–7800)
Neutrophils Relative %: 63 %
Platelets: 229 10*3/uL (ref 140–400)
RBC: 5.18 10*6/uL (ref 4.20–5.80)
RDW: 11.5 % (ref 11.0–15.0)
Total Lymphocyte: 25.4 %
WBC: 6.3 10*3/uL (ref 3.8–10.8)

## 2022-01-21 LAB — LIPID PANEL
Cholesterol: 150 mg/dL (ref <200)
HDL: 56 mg/dL (ref >=40)
LDL Cholesterol (Calc): 80 mg/dL (calc)
Non-HDL Cholesterol (Calc): 94 mg/dL (calc) (ref <130)
Total CHOL/HDL Ratio: 2.7 (calc) (ref <5.0)
Triglycerides: 62 mg/dL (ref <150)

## 2022-01-21 LAB — COMPLETE METABOLIC PANEL WITH GFR
AG Ratio: 2 (calc) (ref 1.0–2.5)
ALT: 19 U/L (ref 9–46)
AST: 17 U/L (ref 10–35)
Albumin: 4.3 g/dL (ref 3.6–5.1)
Alkaline phosphatase (APISO): 61 U/L (ref 35–144)
BUN: 16 mg/dL (ref 7–25)
CO2: 30 mmol/L (ref 20–32)
Calcium: 10 mg/dL (ref 8.6–10.3)
Chloride: 102 mmol/L (ref 98–110)
Creat: 0.84 mg/dL (ref 0.70–1.28)
Globulin: 2.2 g/dL (calc) (ref 1.9–3.7)
Glucose, Bld: 97 mg/dL (ref 65–99)
Potassium: 4.7 mmol/L (ref 3.5–5.3)
Sodium: 138 mmol/L (ref 135–146)
Total Bilirubin: 1 mg/dL (ref 0.2–1.2)
Total Protein: 6.5 g/dL (ref 6.1–8.1)
eGFR: 90 mL/min/{1.73_m2} (ref >=60)

## 2022-01-21 LAB — PSA: PSA: 5.09 ng/mL — ABNORMAL HIGH (ref <=4.00)

## 2022-01-23 ENCOUNTER — Ambulatory Visit (INDEPENDENT_AMBULATORY_CARE_PROVIDER_SITE_OTHER): Payer: Medicare Other | Admitting: Internal Medicine

## 2022-01-23 ENCOUNTER — Encounter: Payer: Self-pay | Admitting: Internal Medicine

## 2022-01-23 VITALS — BP 106/60 | HR 76 | Temp 98.6°F | Ht 73.0 in | Wt 238.8 lb

## 2022-01-23 DIAGNOSIS — N529 Male erectile dysfunction, unspecified: Secondary | ICD-10-CM

## 2022-01-23 DIAGNOSIS — Z85828 Personal history of other malignant neoplasm of skin: Secondary | ICD-10-CM

## 2022-01-23 DIAGNOSIS — Z23 Encounter for immunization: Secondary | ICD-10-CM | POA: Diagnosis not present

## 2022-01-23 DIAGNOSIS — Z0000 Encounter for general adult medical examination without abnormal findings: Secondary | ICD-10-CM | POA: Diagnosis not present

## 2022-01-23 DIAGNOSIS — G609 Hereditary and idiopathic neuropathy, unspecified: Secondary | ICD-10-CM | POA: Diagnosis not present

## 2022-01-23 DIAGNOSIS — Z86010 Personal history of colonic polyps: Secondary | ICD-10-CM

## 2022-01-23 DIAGNOSIS — N400 Enlarged prostate without lower urinary tract symptoms: Secondary | ICD-10-CM | POA: Diagnosis not present

## 2022-01-23 DIAGNOSIS — B009 Herpesviral infection, unspecified: Secondary | ICD-10-CM | POA: Diagnosis not present

## 2022-01-23 DIAGNOSIS — I10 Essential (primary) hypertension: Secondary | ICD-10-CM | POA: Diagnosis not present

## 2022-01-23 DIAGNOSIS — L300 Nummular dermatitis: Secondary | ICD-10-CM | POA: Diagnosis not present

## 2022-01-23 LAB — POCT URINALYSIS DIPSTICK
Bilirubin, UA: NEGATIVE
Blood, UA: NEGATIVE
Glucose, UA: NEGATIVE
Ketones, UA: NEGATIVE
Leukocytes, UA: NEGATIVE
Nitrite, UA: NEGATIVE
Protein, UA: NEGATIVE
Spec Grav, UA: 1.01 (ref 1.010–1.025)
Urobilinogen, UA: 0.2 E.U./dL
pH, UA: 6 (ref 5.0–8.0)

## 2022-01-23 NOTE — Patient Instructions (Addendum)
He is going to stop his BP meds and monitor his BP. We may wind up adding one or 2  meds back. Weight loss has helped BP. Pneumococcal 20 given today. Other vaccines discussed. RTC in one year or as needed. PSA to be faxed by staff to his Urologist

## 2022-01-23 NOTE — Progress Notes (Signed)
Annual Wellness Visit     Patient: Harold [name]., Male    DOB: 1945/10/18, 76 y.o.   MRN: [id number] Visit Date: 01/23/2022  Chief Complaint  Patient presents with   Medicare Wellness   Subjective    Harold P Asaad Gulley. is a 76 y.o. male who presents today for his Annual Wellness Visit.  HPI Here for health maintenance exam and evaluation of medical issues. Has lost about 30 pounds on keto diet.  He underwent surgery for lumbar spinal stenosis at [facility] in November 2022.  He has recovered fully.  Levels in follow-up for L3-S1 with foraminotomies.  He was also referred to a sarcoma clinic for evaluation of right gluteal lipoma incidentally found on MRI.  There was a T2 intense fatty accumulation in the right gluteal region.  Mass fully extended into the right gluteal muscles.  He has never had any issues with this other than some sciatica from his lower back which may not be related.  However after his lumbar decompression, radicular symptoms and pain have improved.  He has remote history of basal cell carcinoma status post Mohs surgery of his right ear.  He is a former smoker and smoked for some 8 years.  He quit in 01-May-1972.  Social alcohol consumption.  Due to consultants did not think this mass was an issue in his gluteal area suspected it was a lipoma.  They say he can return for follow-up in a year.  History of BPH and is followed by urologist.  PSA has been elevated.  A year ago it was 4.33 and is now 5.09.  Results have been sent to his urologist.  2 years ago PSA was 3.71 and 6 years ago it was 3.30  He has a history of hypertension, hyperlipidemia and anxiety.  Longstanding history of numbness in his feet.  He tried Neurontin cream without a lot of relief.  B12 level has been checked previously and was normal.  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  History of nummular dermatitis seen by Dr. Denna Haggard.  History of basal cell carcinoma.  History of bilateral  carpal tunnel surgery 01-May-2016 by Dr. Phillip Heal in.  History of colonoscopy done by Dr. Elicia Lamp in 05/01/2012 with tubular adenoma being removed from the ascending colon.  Had repeat colonoscopy in 05/01/20 with 3 tubular adenomas being removed and 1 hyperplastic polyp.  Repeat study due 05/02/23.  He had hepatitis in 1963/05/02.  Fractured nose at age 57.  Tonsillectomy 1952.  Nasal septal deviation surgery February 2004.  Cervical disc surgery April 2007.  Has been treated with Flomax for BPH and Cialis for erectile dysfunction.  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  He resides in [place].  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  He used to smoke but quit around 05-01-1997.  Social alcohol consumption.  Son deceased in 05-01-16.  He has sons in [place] in [place].  He enjoys golf.  He has a boat and does some fishing.  Family history: Father died at age 30 with MI with history of diabetes and hypertension.  Mother died of complications of Alzheimer's disease.  1 brother in good health.  1 sister with nephrotic syndrome.      Patient Care Team: Elby Showers, MD as PCP - General (Internal Medicine) Lavonna Monarch, MD (Inactive) as  Consulting Physician (Dermatology) Christy Sartorius, MD as Referring Physician (Urology)  Review of Systems no new complaints   Objective    Vitals: Blood pressure 106/60 pulse 76 temperature 98.6 degrees pulse oximetry 98% weight 238 pounds 12.8 ounces BMI 31.51 height 6 feet 1 inches  Skin: Warm and dry.  No cervical adenopathy, thyromegaly or carotid bruits.  Chest clear.  Cardiac exam: Regular rate and rhythm.  Abdomen is soft nondistended without hepatosplenomegaly masses or tenderness.  No lower extremity pitting edema.  Prostate exam deferred to urologist.  Brief neurological exam intact without gross focal deficits.  Affect thought and judgment are normal.    Most recent functional status  assessment:    01/23/2022   11:05 AM  In your present state of health, do you have any difficulty performing the following activities:  Hearing? 0  Vision? 0  Difficulty concentrating or making decisions? 0  Walking or climbing stairs? 0  Dressing or bathing? 0  Doing errands, shopping? 0  Preparing Food and eating ? N  Using the Toilet? N  In the past six months, have you accidently leaked urine? N  Do you have problems with loss of bowel control? N  Managing your Medications? N  Managing your Finances? N  Housekeeping or managing your Housekeeping? N   Most recent fall risk assessment:    01/23/2022   11:04 AM  Fall Risk   Falls in the past year? 0  Number falls in past yr: 0  Injury with Fall? 0  Risk for fall due to : No Fall Risks  Follow up Falls prevention discussed    Most recent depression screenings:    01/23/2022   11:05 AM 12/23/2021    4:08 PM  PHQ 2/9 Scores  PHQ - 2 Score 0 0   Most recent cognitive screening:    12/03/2020   10:20 AM  6CIT Screen  What Year? 0 points  What month? 0 points  What time? 0 points  Count back from 20 0 points  Months in reverse 0 points  Repeat phrase 0 points  Total Score 0 points       Assessment & Plan   History of BPH followed by urologist.  PSA is elevated at 5.09 and was 4.33 a year ago.  He will be seeing urology soon (Dr. Estill Dooms).  Staff is to fax his PSA to urologist.  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  I do not know if this will be successful but he is going to try to experiment with this.  Weight loss has helped his blood pressure.  History of herpes simplex  infection treated with Valtrex  History of basal cell carcinoma  History of nummular dermatitis  History of bilateral carpal tunnel surgery 2018  Healthcare maintenance-pneumococcal 20 vaccine given today  Plan: Continue current medications and follow-up in 1 year or as needed.  Vaccines discussed.  Had flu vaccine November 29, 2021.  Pneumococcal 20 vaccine given today.          Annual wellness visit done today including the all of the following: Reviewed patient's Family Medical History Reviewed and updated list of patient's medical providers Assessment of cognitive impairment was done  Assessed patient's functional ability Established a written schedule for health screening services Health Risk Assessent Completed and Reviewed  Discussed health benefits of physical activity, and encouraged him to engage in regular exercise appropriate for his age and condition.         {I, Elby Showers, MD, have reviewed all documentation for this visit. The documentation on 03/04/22 for the exam, diagnosis, procedures, and orders are all accurate and complete.  LaVon Barron Alvine, CMA

## 2022-01-23 NOTE — Progress Notes (Signed)
Labs sent to Dr Audrie Gallus at [facility]

## 2022-01-24 ENCOUNTER — Ambulatory Visit: Payer: Medicare Other | Admitting: Internal Medicine

## 2022-01-24 DIAGNOSIS — M9963 Osseous and subluxation stenosis of intervertebral foramina of lumbar region: Secondary | ICD-10-CM | POA: Diagnosis not present

## 2022-01-24 DIAGNOSIS — M7989 Other specified soft tissue disorders: Secondary | ICD-10-CM | POA: Diagnosis not present

## 2022-01-24 DIAGNOSIS — M48061 Spinal stenosis, lumbar region without neurogenic claudication: Secondary | ICD-10-CM | POA: Diagnosis not present

## 2022-01-24 DIAGNOSIS — Z87891 Personal history of nicotine dependence: Secondary | ICD-10-CM | POA: Diagnosis not present

## 2022-01-24 DIAGNOSIS — D4819 Other specified neoplasm of uncertain behavior of connective and other soft tissue: Secondary | ICD-10-CM | POA: Diagnosis not present

## 2022-04-06 DIAGNOSIS — Z85828 Personal history of other malignant neoplasm of skin: Secondary | ICD-10-CM | POA: Diagnosis not present

## 2022-04-06 DIAGNOSIS — D225 Melanocytic nevi of trunk: Secondary | ICD-10-CM | POA: Diagnosis not present

## 2022-04-06 DIAGNOSIS — C44329 Squamous cell carcinoma of skin of other parts of face: Secondary | ICD-10-CM | POA: Diagnosis not present

## 2022-04-06 DIAGNOSIS — Z859 Personal history of malignant neoplasm, unspecified: Secondary | ICD-10-CM | POA: Diagnosis not present

## 2022-04-06 DIAGNOSIS — D485 Neoplasm of uncertain behavior of skin: Secondary | ICD-10-CM | POA: Diagnosis not present

## 2022-04-06 DIAGNOSIS — L300 Nummular dermatitis: Secondary | ICD-10-CM | POA: Diagnosis not present

## 2022-04-06 DIAGNOSIS — L570 Actinic keratosis: Secondary | ICD-10-CM | POA: Diagnosis not present

## 2022-04-11 ENCOUNTER — Ambulatory Visit (INDEPENDENT_AMBULATORY_CARE_PROVIDER_SITE_OTHER): Payer: Medicare Other | Admitting: Urology

## 2022-04-11 VITALS — BP 147/78 | HR 62 | Ht 74.0 in | Wt 230.0 lb

## 2022-04-11 DIAGNOSIS — N138 Other obstructive and reflux uropathy: Secondary | ICD-10-CM

## 2022-04-11 DIAGNOSIS — N401 Enlarged prostate with lower urinary tract symptoms: Secondary | ICD-10-CM | POA: Diagnosis not present

## 2022-04-11 LAB — URINALYSIS
Bilirubin, UA: NEGATIVE
Blood, UA: NEGATIVE
Glucose, UA: NEGATIVE mg/dL
Ketones, POC UA: NEGATIVE mg/dL
Leukocytes, UA: NEGATIVE
Nitrite, UA: NEGATIVE
Protein Ur, POC: NEGATIVE mg/dL
Spec Grav, UA: 1.025 (ref 1.010–1.025)
Urobilinogen, UA: 0.2 E.U./dL
pH, UA: 5.5 (ref 5.0–8.0)

## 2022-04-11 LAB — BLADDER SCAN AMB NON-IMAGING

## 2022-04-11 MED ORDER — FINASTERIDE 5 MG PO TABS: 5.0000 mg | ORAL_TABLET | Freq: Every day | ORAL | 3 refills | Status: DC

## 2022-04-11 MED ORDER — TAMSULOSIN HCL 0.4 MG PO CAPS: 0.4000 mg | ORAL_CAPSULE | Freq: Every day | ORAL | 3 refills | Status: DC

## 2022-04-11 MED ORDER — TADALAFIL 5 MG PO TABS: 5.0000 mg | ORAL_TABLET | Freq: Every day | ORAL | 5 refills | Status: DC

## 2022-04-11 NOTE — Progress Notes (Signed)
Assessment: 1. Benign prostatic hyperplasia with urinary obstruction      Plan: Today I had a long discussion with the patient regarding his longstanding lower urinary tract symptoms which have gotten worse over the past year despite being on combination therapy with tamsulosin and daily Cialis.  I also had a long discussion with him regarding his borderline PSA which is normal when adjusted for age and the issues and controversies regarding prostate cancer early detection.  Following our discussion today including additional medical options the patient is elected additional medical therapy. Patient will continue daily tadalafil (for BPH and ED) and tamsulosin. Will begin new medical therapy with finasteride 5 mg daily. Nature of medication including proper utilization as well as potential adverse events and side effects reviewed.  Patient understands the importance of ongoing follow-up and monitoring his PSA for proper response. I plan to see him back in January following his annual visit with PCP and PSA testing.  Chief Complaint: BPH  History of Present Illness:  Harold [name]) is a 77 y.o. male who is seen in consultation from [place], Cresenciano Lick, MD for evaluation of BPH. Harold [name] has a long history of BPH and borderline PSA/normal ade-adjusted and has been followed for a number of years by Dr. Estill Dooms most recently at [place] last visit 01/2021. Patient most recently over the last few years has been taking combination therapy with daily tadalafil and tamsulosin.  Tamsulosin by itself was not effective.  Patient is also benefited in terms of erectile function while taking the daily tadalafil. Harold [name] reports that over the last year or so he has had some worsening lower urinary tract symptoms.  Current IPSS = 14.  He has noticed more significant urgency during this timeframe.  Patient denies other urologic complaints including hematuria. No family history of prostate cancer   PSA  data: 09/2016   3.3 11/2017  3.9 11/2018  3.2 11/2019  3.71 11/2020  4.33 01/2022  5.09   Past Medical History:  Past Medical History:  Diagnosis Date   Allergy    Atypical mole    BCC (basal cell carcinoma of skin) 11/20/2017   right ear rim Mohs   Dependent edema    Granuloma annulare    Hyperlipidemia    Hypertension    Vitamin D deficiency     Past Surgical History:  Past Surgical History:  Procedure Laterality Date   Birthmark removed  02/06[birth year]1946   at [facility], BILATERAL  04/25/2012   CERVICAL [place] SURGERY  02/06/2002   COLONOSCOPY  06/03/2002   "scope not long enough" per pt.   KNEE SURGERY  02/06/2006   NASAL SEPTUM SURGERY     RETINAL DETACHMENT SURGERY Right 2017   SHOULDER ARTHROSCOPY  04/29/2009   subacromial decompression, labral debridement, rotator cuff debridement, open reconstruction of complex rotator interval/supraspinatus rotator cuff tear   TONSILLECTOMY      Allergies:  Allergies  Allergen Reactions   Celebrex [Celecoxib]    Cephalosporins    Cephalexin Rash    Family History:  Family History  Problem Relation Age of Onset   Heart disease Father    Diabetes Father    Hypertension Father    Kidney disease Sister    Colon cancer Neg Hx    Colon polyps Neg Hx    Esophageal cancer Neg Hx    Rectal cancer Neg Hx    Stomach cancer Neg Hx     Social History:  Social History  Tobacco Use   Smoking status: Former    Packs/day: 0.25    Types: Cigarettes    Quit date: 02/06/1997    Years since quitting: 25.1   Smokeless tobacco: Never  Vaping Use   Vaping Use: Never used  Substance Use Topics   Alcohol use: Yes    Alcohol/week: 7.0 standard drinks of alcohol    Types: 7 Glasses of wine per week   Drug use: No    Review of symptoms:  Constitutional:  Negative for unexplained weight loss, night sweats, fever, chills ENT:  Negative for nose bleeds, sinus pain, painful swallowing CV:  Negative for chest pain,  shortness of breath, exercise intolerance, palpitations, loss of consciousness Resp:  Negative for cough, wheezing, shortness of breath GI:  Negative for nausea, vomiting, diarrhea, bloody stools GU:  Positives noted in HPI; otherwise negative for gross hematuria, dysuria, urinary incontinence Neuro:  Negative for seizures, poor balance, limb weakness, slurred speech Psych:  Negative for lack of energy, depression, anxiety Endocrine:  Negative for polydipsia, polyuria, symptoms of hypoglycemia (dizziness, hunger, sweating) Hematologic:  Negative for anemia, purpura, petechia, prolonged or excessive bleeding, use of anticoagulants  Allergic:  Negative for difficulty breathing or choking as a result of exposure to anything; no shellfish allergy; no allergic response (rash/itch) to materials, foods  Physical exam: BP (!) 147/78   Pulse 62   Ht '6\' 2"'$  (1.88 m)   Wt 230 lb (104.3 kg)   BMI 29.53 kg/m  GENERAL APPEARANCE:  Well appearing, well developed, well nourished, NAD  GU: Normal external genitalia DRE: Normal sphincter tone; prostate is large approximately 60 g without evidence of nodules or induration.

## 2022-04-19 DIAGNOSIS — C44329 Squamous cell carcinoma of skin of other parts of face: Secondary | ICD-10-CM | POA: Diagnosis not present

## 2022-04-25 DIAGNOSIS — H35371 Puckering of macula, right eye: Secondary | ICD-10-CM | POA: Diagnosis not present

## 2022-04-25 DIAGNOSIS — H524 Presbyopia: Secondary | ICD-10-CM | POA: Diagnosis not present

## 2022-04-26 DIAGNOSIS — L905 Scar conditions and fibrosis of skin: Secondary | ICD-10-CM | POA: Diagnosis not present

## 2022-05-23 ENCOUNTER — Encounter: Payer: Self-pay | Admitting: Urology

## 2022-06-08 ENCOUNTER — Other Ambulatory Visit (INDEPENDENT_AMBULATORY_CARE_PROVIDER_SITE_OTHER): Payer: Medicare Other

## 2022-06-08 ENCOUNTER — Encounter: Payer: Self-pay | Admitting: Physician Assistant

## 2022-06-08 ENCOUNTER — Ambulatory Visit (INDEPENDENT_AMBULATORY_CARE_PROVIDER_SITE_OTHER): Payer: Medicare Other | Admitting: Physician Assistant

## 2022-06-08 DIAGNOSIS — M1712 Unilateral primary osteoarthritis, left knee: Secondary | ICD-10-CM

## 2022-06-08 DIAGNOSIS — G8929 Other chronic pain: Secondary | ICD-10-CM

## 2022-06-08 DIAGNOSIS — M1711 Unilateral primary osteoarthritis, right knee: Secondary | ICD-10-CM | POA: Diagnosis not present

## 2022-06-08 DIAGNOSIS — M25561 Pain in right knee: Secondary | ICD-10-CM

## 2022-06-08 DIAGNOSIS — M25562 Pain in left knee: Secondary | ICD-10-CM

## 2022-06-08 MED ORDER — LIDOCAINE HCL 1 % IJ SOLN
3.0000 mL | INTRAMUSCULAR | Status: AC | PRN
  Administered 2022-06-08: 3 mL

## 2022-06-08 MED ORDER — METHYLPREDNISOLONE ACETATE 40 MG/ML IJ SUSP
40.0000 mg | INTRAMUSCULAR | Status: AC | PRN
Start: 2022-06-08 — End: 2022-06-08
  Administered 2022-06-08: 40 mg via INTRA_ARTICULAR

## 2022-06-08 MED ORDER — METHYLPREDNISOLONE ACETATE 40 MG/ML IJ SUSP
40.0000 mg | INTRAMUSCULAR | Status: AC | PRN
  Administered 2022-06-08: 40 mg via INTRA_ARTICULAR

## 2022-06-08 NOTE — Progress Notes (Signed)
   Procedure Note  Patient: Harold [name].             Date of Birth: 1945-03-02           MRN: [id number]             Visit Date: 06/08/2022 HPI: Harold [name] comes in today with bilateral knee pain.  They have not seen him since 2021.  Last time we saw him actually for knee pain was in 2019.  He states he has had no falls or injuries to either knee.  Both knees do pop on him at times.  He notes it takes him a while to start walking due to knee pain.  He has tried CBD oil Voltaren gel Advil and Tylenol.  He also uses heat.  At times his knee pain does awaken.  States both knees are painful left currently is more painful than the right.  He describes pain in the lateral aspect of the left knee.  Especially whenever he is getting in and out of her car and has to bend the knee he has pain over the lateral aspect.  He feels this pain in the left knee has became worse over the past 3 weeks.  Patient is nondiabetic.  Review of systems: Denies any fevers chills.  Physical exam: General: Well-developed well-nourished male no acute distress mood affect appropriate  Psych: Alert and oriented x 3   Bilateral knees good range of motion both knees.  He has patellofemoral crepitus bilateral knees with passive range of motion.  No instability valgus varus stressing of either knee.  McMurray's is negative bilaterally.  No abnormal warmth erythema or effusion of either knee.  Maximal tenderness left knee is over the lateral joint line and right knee is maximally tender over the medial joint line.  Calf supple nontender bilaterally.  Bilateral hips: Good range of motion both hips without pain    Radiographs: Left knee 2 views: No acute fracture.  Knee is well located.  Moderate narrowing lateral compartment with osteophytes off the lateral joint line.  Moderate patellofemoral changes with osteophytes.  Medial compartment with mild arthritic changes.  No acute fractures no acute findings.  Calcification seen  in posterior medial proximal tibia soft tissue region unchanged from prior films in 2019.  Right knee 2 views: No acute fracture knee is well located.  Bone-on-bone medial compartment.  Lateral compartment with minimal arthritic changes.  Mild to moderate patellofemoral changes.  Procedures: Visit Diagnoses:  1. Chronic pain of left knee   2. Chronic pain of right knee     Large Joint Inj: bilateral knee on 06/08/2022 5:01 PM Indications: pain Details: 22 G 1.5 in needle, anterolateral approach  Arthrogram: No  Medications (Right): 3 mL lidocaine 1 %; 40 mg methylPREDNISolone acetate 40 MG/ML Medications (Left): 3 mL lidocaine 1 %; 40 mg methylPREDNISolone acetate 40 MG/ML Outcome: tolerated well, no immediate complications Procedure, treatment alternatives, risks and benefits explained, specific risks discussed. Consent was given by the patient. Immediately prior to procedure a time out was called to verify the correct patient, procedure, equipment, support staff and site/side marked as required. Patient was prepped and draped in the usual sterile fashion.     Plan: He will work on quad strengthening exercises.  Knee friendly exercises discussed.  He will follow-up with [place] on as-needed basis pain persist or becomes worse.  Questions were encouraged and answered at

## 2022-09-06 DIAGNOSIS — M65351 Trigger finger, right little finger: Secondary | ICD-10-CM | POA: Diagnosis not present

## 2022-10-10 ENCOUNTER — Other Ambulatory Visit: Payer: Self-pay | Admitting: Internal Medicine

## 2022-10-10 DIAGNOSIS — M25511 Pain in right shoulder: Secondary | ICD-10-CM | POA: Diagnosis not present

## 2022-10-13 DIAGNOSIS — M65321 Trigger finger, right index finger: Secondary | ICD-10-CM | POA: Diagnosis not present

## 2022-10-13 DIAGNOSIS — M65351 Trigger finger, right little finger: Secondary | ICD-10-CM | POA: Diagnosis not present

## 2022-10-23 ENCOUNTER — Ambulatory Visit: Payer: Medicare Other | Admitting: Dermatology

## 2022-11-07 DIAGNOSIS — D225 Melanocytic nevi of trunk: Secondary | ICD-10-CM | POA: Diagnosis not present

## 2022-11-07 DIAGNOSIS — L814 Other melanin hyperpigmentation: Secondary | ICD-10-CM | POA: Diagnosis not present

## 2022-11-07 DIAGNOSIS — D2361 Other benign neoplasm of skin of right upper limb, including shoulder: Secondary | ICD-10-CM | POA: Diagnosis not present

## 2022-11-07 DIAGNOSIS — L821 Other seborrheic keratosis: Secondary | ICD-10-CM | POA: Diagnosis not present

## 2022-11-07 DIAGNOSIS — L578 Other skin changes due to chronic exposure to nonionizing radiation: Secondary | ICD-10-CM | POA: Diagnosis not present

## 2022-11-07 DIAGNOSIS — B078 Other viral warts: Secondary | ICD-10-CM | POA: Diagnosis not present

## 2022-11-07 DIAGNOSIS — Z85828 Personal history of other malignant neoplasm of skin: Secondary | ICD-10-CM | POA: Diagnosis not present

## 2022-11-07 DIAGNOSIS — L738 Other specified follicular disorders: Secondary | ICD-10-CM | POA: Diagnosis not present

## 2022-11-07 DIAGNOSIS — L570 Actinic keratosis: Secondary | ICD-10-CM | POA: Diagnosis not present

## 2022-11-07 DIAGNOSIS — Z859 Personal history of malignant neoplasm, unspecified: Secondary | ICD-10-CM | POA: Diagnosis not present

## 2022-12-12 DIAGNOSIS — Z23 Encounter for immunization: Secondary | ICD-10-CM | POA: Diagnosis not present

## 2022-12-25 ENCOUNTER — Ambulatory Visit (INDEPENDENT_AMBULATORY_CARE_PROVIDER_SITE_OTHER): Payer: Medicare Other | Admitting: Physician Assistant

## 2022-12-25 ENCOUNTER — Other Ambulatory Visit: Payer: Self-pay

## 2022-12-25 DIAGNOSIS — M170 Bilateral primary osteoarthritis of knee: Secondary | ICD-10-CM

## 2022-12-25 DIAGNOSIS — M79671 Pain in right foot: Secondary | ICD-10-CM | POA: Diagnosis not present

## 2022-12-25 DIAGNOSIS — M1711 Unilateral primary osteoarthritis, right knee: Secondary | ICD-10-CM

## 2022-12-25 DIAGNOSIS — M1712 Unilateral primary osteoarthritis, left knee: Secondary | ICD-10-CM

## 2022-12-25 MED ORDER — LIDOCAINE HCL 1 % IJ SOLN
3.0000 mL | INTRAMUSCULAR | Status: AC | PRN
Start: 2022-12-25 — End: 2022-12-25
  Administered 2022-12-25: 3 mL

## 2022-12-25 MED ORDER — METHYLPREDNISOLONE ACETATE 40 MG/ML IJ SUSP
40.0000 mg | INTRAMUSCULAR | Status: AC | PRN
Start: 2022-12-25 — End: 2022-12-25
  Administered 2022-12-25: 40 mg via INTRA_ARTICULAR

## 2022-12-25 NOTE — Progress Notes (Signed)
   Procedure Note  Patient: Harold [name].             Date of Birth: [birth year]           MRN: [id number]             Visit Date: 12/25/2022 HPI: Harold [name] comes in today requesting cortisone injections both knees.  Last injections were 06-08-22.  He notes that the last injections gave him good results until just recently.  No new injury.  He is wearing compression sleeves on both knees and finds these beneficial.  Also finds CBD cream Tylenol and Advil would be beneficial.  New complaint of right foot pain points to the base of the fourth and fifth metatarsal region.  History of metatarsal fracture as a teenager treated in a cast.  Denies any new injury to the foot.  Review of systems: See HPI otherwise negative  Physical exam: General Well-developed well-nourished male ambulates without any assistive device.  Bilateral knees good range of motion.  No abnormal warmth erythema or effusion of either knee.  No instability valgus varus stressing of either knee. Right foot: No abnormal warmth erythema and dorsal pedal pulses 2+.  Nontender over the posterior tibial tendons peroneal tendons.  Dorsiflexion plantarflexion right ankle is intact.  Tenderness at the base of the fourth metatarsal.  Radiographs: 3 views right foot: No acute fracture mild arthritic changes at the fourth tarsometatarsal joint.  Otherwise no acute findings. Procedures: Visit Diagnoses:  1. Pain in right foot     Large Joint Inj: bilateral knee on 12/25/2022 5:23 PM Indications: pain Details: 22 G 1.5 in needle, anterolateral approach  Arthrogram: No  Medications (Right): 3 mL lidocaine 1 %; 40 mg methylPREDNISolone acetate 40 MG/ML Medications (Left): 3 mL lidocaine 1 %; 40 mg methylPREDNISolone acetate 40 MG/ML Outcome: tolerated well, no immediate complications Procedure, treatment alternatives, risks and benefits explained, specific risks discussed. Consent was given by the patient. Immediately prior to  procedure a time out was called to verify the correct patient, procedure, equipment, support staff and site/side marked as required. Patient was prepped and draped in the usual sterile fashion.    Plan: He knows to wait at least 3 months between cortisone injections to the knees.  In regards to the right foot recommend Voltaren gel 2 g up to 4 times daily to the area of maximal tenderness.  Follow-up as needed.  Questions encouraged and answered.

## 2023-01-02 DIAGNOSIS — B078 Other viral warts: Secondary | ICD-10-CM | POA: Diagnosis not present

## 2023-01-02 DIAGNOSIS — R200 Anesthesia of skin: Secondary | ICD-10-CM | POA: Diagnosis not present

## 2023-01-22 NOTE — Progress Notes (Deleted)
Annual Wellness Visit    Patient Care Team: Xian Apostol, Luanna Cole, MD as PCP - General (Internal Medicine) Janalyn Harder, MD (Inactive) as Consulting Physician (Dermatology) Wynona Canes, MD as Referring Physician (Urology)  Visit Date: 02/02/23   Chief Complaint  Patient presents with   Medicare Wellness    Subjective:   Patient: Harold Ports., Male    DOB: [birth year], 77 y.o.   MRN: [id number]  Deren P Winfred [place]. is a 77 y.o. Male who presents today for his Annual Wellness Visit. History of allergies, basal cell carcinoma 2019, dependent edema, granuloma annulare, hyperlipidemia, hypertension, Vitamin D deficiency.    Past Medical History:  Diagnosis Date   Allergy    Atypical mole    BCC (basal cell carcinoma of skin) 11/20/2017   right ear rim Mohs   Dependent edema    Granuloma annulare    Hyperlipidemia    Hypertension    Vitamin D deficiency      Family History  Problem Relation Age of Onset   Heart disease Father    Diabetes Father    Hypertension Father    Kidney disease Sister    Colon cancer Neg Hx    Colon polyps Neg Hx    Esophageal cancer Neg Hx    Rectal cancer Neg Hx    Stomach cancer Neg Hx      Social History   Social History Narrative   Not on file     ROS    Objective:   Vitals: Ht 6\' 2"  (1.88 m)   BMI 29.53 kg/m   Physical Exam   Most recent functional status assessment:    02/02/2023   10:40 AM  In your present state of health, do you have any difficulty performing the following activities:  Hearing? 0  Vision? 0  Difficulty concentrating or making decisions? 0  Walking or climbing stairs? 0  Dressing or bathing? 0  Doing errands, shopping? 0  Preparing Food and eating ? N  Using the Toilet? N  In the past six months, have you accidently leaked urine? N  Do you have problems with loss of bowel control? N  Managing your Medications? N  Managing your Finances? N  Housekeeping or managing your  Housekeeping? N   Most recent fall risk assessment:    01/25/2023   10:55 AM  Fall Risk   Falls in the past year? 0  Number falls in past yr: 0  Injury with Fall? 0    Most recent depression screenings:    01/23/2022   11:05 AM 12/23/2021    4:08 PM  PHQ 2/9 Scores  PHQ - 2 Score 0 0   Most recent cognitive screening:    01/23/2022   11:06 AM  6CIT Screen  What Year? 0 points  What month? 0 points  What time? 0 points  Count back from 20 0 points  Months in reverse 0 points  Repeat phrase 10 points  Total Score 10 points     Results:   Studies obtained and personally reviewed by me:  Colonoscopy done by Dr. Julio Alm in 2014 with tubular adenoma being removed from the ascending colon.  Had repeat colonoscopy in 2022 with 3 tubular adenomas being removed and 1 hyperplastic polyp.  Repeat study due 2025.  Labs:       Component Value Date/Time   NA 141 01/29/2023 1250   NA 141 07/15/2021 0000   K 4.9 01/29/2023 1250  CL 104 01/29/2023 1250   CO2 30 01/29/2023 1250   GLUCOSE 102 (H) 01/29/2023 1250   BUN 17 01/29/2023 1250   BUN 24 (A) 07/15/2021 0000   CREATININE 0.72 01/29/2023 1250   CALCIUM 9.6 01/29/2023 1250   PROT 6.4 01/29/2023 1250   ALBUMIN 4.1 09/12/2016 0933   AST 14 01/29/2023 1250   ALT 15 01/29/2023 1250   ALKPHOS 60 09/12/2016 0933   BILITOT 0.8 01/29/2023 1250   GFRNONAA 86 11/21/2019 0929   GFRAA 100 11/21/2019 0929     Lab Results  Component Value Date   WBC 4.9 01/29/2023   HGB 16.7 01/29/2023   HCT 48.7 01/29/2023   MCV 94.6 01/29/2023   PLT 237 01/29/2023    Lab Results  Component Value Date   CHOL 165 01/29/2023   HDL 57 01/29/2023   LDLCALC 93 01/29/2023   TRIG 60 01/29/2023   CHOLHDL 2.9 01/29/2023    Lab Results  Component Value Date   HGBA1C 5.0 07/15/2021     Lab Results  Component Value Date   TSH 1.91 07/15/2021     Lab Results  Component Value Date   PSA 2.19 01/29/2023   PSA 5.09 (H)  01/20/2022   PSA 4.33 (H) 12/02/2020    Assessment & Plan:   Vitamin D deficiency: and takes Vitamin D supplement.  History of benign prostatic hyperplasia with urinary obstruction treated with finasteride 5 mg daily, daily tadalafil, tamsulosin. Seen by urologist, Dr. Marcha Solders. PSA at 2.19.  He underwent surgery for lumbar spinal stenosis at [facility] in November 2022. Radicular symptoms and pain have improved.   He has remote history of basal cell carcinoma status post Mohs surgery of his right ear.  He is a former smoker and smoked for some 8 years.  He quit in 1974.  Social alcohol consumption.  Due to consultants did not think this mass was an issue in his gluteal area suspected it was a lipoma.  They say he can return for follow-up in a year.   He has a history of hypertension, hyperlipidemia and anxiety. Lipid panel normal. Longstanding history of numbness in his feet.  He tried Neurontin cream without a lot of relief.  B12 level has been checked previously and was normal.  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   History of nummular dermatitis seen by Dr. Jorja Loa.  History of basal cell carcinoma.  History of bilateral carpal tunnel surgery 2018 by Dr. Cheree Ditto.     He had hepatitis in 1965.  Fractured nose at age 28.  Tonsillectomy 1952.  Nasal septal deviation surgery February 2004.  Cervical disc surgery April 2007.  Has been treated with Flomax for BPH and Cialis for erectile dysfunction.  01/29/23 labs reviewed today. Glucose slightly elevated at 102. Kidney, liver functions normal. Electrolytes normal. Blood proteins normal. CBC normal.   Colonoscopy done by Dr. Julio Alm in 2014 with tubular adenoma being removed from the ascending colon.  Had repeat colonoscopy in 2022 with 3 tubular adenomas being removed and 1 hyperplastic polyp.  Repeat study due 2025.  Vaccine counseling: due for tetanus vaccine. UTD on high-dose flu, pneumococcal 20  vaccines.  Return in 1 year or as needed.     Annual wellness visit done today including the all of the following: Reviewed patient's Family Medical History Reviewed and updated list of patient's medical providers Assessment of cognitive impairment was done Assessed patient's functional ability Established a written  schedule for health screening services Health Risk Assessent Completed and Reviewed  Discussed health benefits of physical activity, and encouraged him to engage in regular exercise appropriate for his age and condition.        I,Alexander Ruley,acting as a Neurosurgeon for Margaree Mackintosh, MD.,have documented all relevant documentation on the behalf of Margaree Mackintosh, MD,as directed by  Margaree Mackintosh, MD while in the presence of Margaree Mackintosh, MD.   I, Margaree Mackintosh, MD, have reviewed all documentation for this visit. The documentation on 02/02/23 for the exam, diagnosis, procedures, and orders are all accurate and complete.

## 2023-01-29 ENCOUNTER — Other Ambulatory Visit: Payer: Medicare Other

## 2023-01-29 DIAGNOSIS — Z0000 Encounter for general adult medical examination without abnormal findings: Secondary | ICD-10-CM

## 2023-01-29 DIAGNOSIS — N400 Enlarged prostate without lower urinary tract symptoms: Secondary | ICD-10-CM

## 2023-01-29 DIAGNOSIS — M7989 Other specified soft tissue disorders: Secondary | ICD-10-CM | POA: Diagnosis not present

## 2023-01-29 DIAGNOSIS — I10 Essential (primary) hypertension: Secondary | ICD-10-CM | POA: Diagnosis not present

## 2023-01-29 DIAGNOSIS — Z87891 Personal history of nicotine dependence: Secondary | ICD-10-CM | POA: Diagnosis not present

## 2023-01-29 DIAGNOSIS — D179 Benign lipomatous neoplasm, unspecified: Secondary | ICD-10-CM | POA: Diagnosis not present

## 2023-01-29 DIAGNOSIS — R222 Localized swelling, mass and lump, trunk: Secondary | ICD-10-CM | POA: Diagnosis not present

## 2023-01-30 LAB — COMPLETE METABOLIC PANEL WITH GFR
AG Ratio: 2 (calc) (ref 1.0–2.5)
ALT: 15 U/L (ref 9–46)
AST: 14 U/L (ref 10–35)
Albumin: 4.3 g/dL (ref 3.6–5.1)
Alkaline phosphatase (APISO): 60 U/L (ref 35–144)
BUN: 17 mg/dL (ref 7–25)
CO2: 30 mmol/L (ref 20–32)
Calcium: 9.6 mg/dL (ref 8.6–10.3)
Chloride: 104 mmol/L (ref 98–110)
Creat: 0.72 mg/dL (ref 0.70–1.28)
Globulin: 2.1 g/dL (ref 1.9–3.7)
Glucose, Bld: 102 mg/dL — ABNORMAL HIGH (ref 65–99)
Potassium: 4.9 mmol/L (ref 3.5–5.3)
Sodium: 141 mmol/L (ref 135–146)
Total Bilirubin: 0.8 mg/dL (ref 0.2–1.2)
Total Protein: 6.4 g/dL (ref 6.1–8.1)
eGFR: 94 mL/min/{1.73_m2} (ref >=60)

## 2023-01-30 LAB — CBC WITH DIFFERENTIAL/PLATELET
Absolute Lymphocytes: 1710 {cells}/uL (ref 850–3900)
Absolute Monocytes: 426 {cells}/uL (ref 200–950)
Basophils Absolute: 39 {cells}/uL (ref 0–200)
Basophils Relative: 0.8 %
Eosinophils Absolute: 49 {cells}/uL (ref 15–500)
Eosinophils Relative: 1 %
HCT: 48.7 % (ref 38.5–50.0)
Hemoglobin: 16.7 g/dL (ref 13.2–17.1)
MCH: 32.4 pg (ref 27.0–33.0)
MCHC: 34.3 g/dL (ref 32.0–36.0)
MCV: 94.6 fL (ref 80.0–100.0)
MPV: 10.1 fL (ref 7.5–12.5)
Monocytes Relative: 8.7 %
Neutro Abs: 2675 {cells}/uL (ref 1500–7800)
Neutrophils Relative %: 54.6 %
Platelets: 237 10*3/uL (ref 140–400)
RBC: 5.15 10*6/uL (ref 4.20–5.80)
RDW: 11.8 % (ref 11.0–15.0)
Total Lymphocyte: 34.9 %
WBC: 4.9 10*3/uL (ref 3.8–10.8)

## 2023-01-30 LAB — LIPID PANEL
Cholesterol: 165 mg/dL (ref <200)
HDL: 57 mg/dL (ref >=40)
LDL Cholesterol (Calc): 93 mg/dL
Non-HDL Cholesterol (Calc): 108 mg/dL (ref <130)
Total CHOL/HDL Ratio: 2.9 (calc) (ref <5.0)
Triglycerides: 60 mg/dL (ref <150)

## 2023-01-30 LAB — PSA: PSA: 2.19 ng/mL (ref <=4.00)

## 2023-02-02 ENCOUNTER — Encounter: Payer: Self-pay | Admitting: Internal Medicine

## 2023-02-02 ENCOUNTER — Ambulatory Visit (INDEPENDENT_AMBULATORY_CARE_PROVIDER_SITE_OTHER): Payer: Medicare Other | Admitting: Internal Medicine

## 2023-02-02 VITALS — BP 118/62 | HR 70 | Ht 74.0 in | Wt 247.0 lb

## 2023-02-02 DIAGNOSIS — Z0000 Encounter for general adult medical examination without abnormal findings: Secondary | ICD-10-CM

## 2023-02-02 DIAGNOSIS — M1712 Unilateral primary osteoarthritis, left knee: Secondary | ICD-10-CM | POA: Diagnosis not present

## 2023-02-02 DIAGNOSIS — N400 Enlarged prostate without lower urinary tract symptoms: Secondary | ICD-10-CM

## 2023-02-02 DIAGNOSIS — I10 Essential (primary) hypertension: Secondary | ICD-10-CM

## 2023-02-02 DIAGNOSIS — G609 Hereditary and idiopathic neuropathy, unspecified: Secondary | ICD-10-CM

## 2023-02-02 DIAGNOSIS — Z860101 Personal history of adenomatous and serrated colon polyps: Secondary | ICD-10-CM | POA: Diagnosis not present

## 2023-02-02 NOTE — Progress Notes (Signed)
Patient Care Team: Margaree Mackintosh, MD as PCP - General (Internal Medicine) Janalyn Harder, MD (Inactive) as Consulting Physician (Dermatology) Wynona Canes, MD as Referring Physician (Urology)  Visit Date: 02/02/23  Subjective:    Patient ID: Harold [name] , Male   DOB: [birth year], 77 y.o.    MRN: [id number]   Harold P Reven Raptis. is a 77 y.o. Male who presents today for his Annual Wellness Visit. History of allergies, basal cell carcinoma 2017-02-28, dependent edema, granuloma annulare, hyperlipidemia, hypertension, Vitamin D deficiency.   Has some numbness in right first finger. Seen by Dr. Merlyn Lot. He has knee injections occasionally for extended travel with last injections in summer 2024. Uses topical CBD oil.  History of Vitamin D deficiency and takes Vitamin D supplement.  History of benign prostatic hyperplasia with urinary obstruction treated with finasteride 5 mg daily, daily tadalafil, tamsulosin. Seen by urologist, Dr. Marcha Solders. PSA at 2.19.  He underwent surgery for lumbar spinal stenosis at [facility] in November 2022. Radicular symptoms and pain have improved.   He has remote history of basal cell carcinoma status post Mohs surgery of his right ear.  He is a former smoker and smoked for some 8 years.  He quit in Feb 29, 1972.  Social alcohol consumption.     He has a history of hypertension, hyperlipidemia and anxiety. Lipid panel normal. Longstanding history of numbness in his feet.  He tried Neurontin cream without a lot of relief.  B12 level has been checked previously and was normal.  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   History of nummular dermatitis seen by Dr. Jorja Loa.  History of basal cell carcinoma.  History of bilateral carpal tunnel surgery 02-29-2016 by Dr. Cheree Ditto.     He had hepatitis in 03-01-63.  Fractured nose at age 5.  Tonsillectomy 1952.  Nasal septal deviation surgery February 2004.  Cervical disc surgery April 2007.  Has been treated  with Flomax for BPH and Cialis for erectile dysfunction.  Denies feet swelling.  01/29/23 labs reviewed today. Glucose slightly elevated at 102. Kidney, liver functions normal. Electrolytes normal. Blood proteins normal. CBC normal.   Colonoscopy done by Dr. Julio Alm in Feb 29, 2012 with tubular adenoma being removed from the ascending colon.  Had repeat colonoscopy in 02-29-2020 with 3 tubular adenomas being removed and 1 hyperplastic polyp.  Repeat study due March 01, 2023.  Social history: He is retired.  He resides in [place].  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  He used to smoke but quit around 02-28-97.  Social alcohol consumption.  Son deceased in 2016/02/29.  He has sons in [place] and [address] [place].  He enjoys golf.  He has a boat and does some fishing.   Family history: Father died at age 51 with MI with history of diabetes and hypertension.  Mother died of complications of Alzheimer's disease.  1 brother in good health.  1 sister with nephrotic syndrome.  Past Medical History:  Diagnosis Date   Allergy    Atypical mole    BCC (basal cell carcinoma of skin) 11/20/2017   right ear rim Mohs   Dependent edema    Granuloma annulare    Hyperlipidemia    Hypertension    Vitamin D deficiency      Family History  Problem Relation Age of Onset   Heart disease Father    Diabetes Father  Hypertension Father    Kidney disease Sister    Colon cancer Neg Hx    Colon polyps Neg Hx    Esophageal cancer Neg Hx    Rectal cancer Neg Hx    Stomach cancer Neg Hx     Social History   Social History Narrative   Not on file      Review of Systems  Constitutional:  Negative for fever and malaise/fatigue.  HENT:  Negative for congestion.   Eyes:  Negative for blurred vision.  Respiratory:  Negative for cough and shortness of breath.   Cardiovascular:  Negative for chest pain, palpitations and leg swelling.  Gastrointestinal:  Negative for vomiting.   Musculoskeletal:  Negative for back pain.  Skin:  Negative for rash.  Neurological:  Negative for loss of consciousness and headaches.        Objective:   Vitals: BP 118/62   Pulse 70   Ht 6\' 2"  (1.88 m)   Wt 247 lb (112 kg)   SpO2 96%   BMI 31.71 kg/m    Physical Exam Constitutional:      General: He is not in acute distress.    Appearance: Normal appearance. He is not ill-appearing.  HENT:     Head: Normocephalic and atraumatic.  Neck:     Vascular: No carotid bruit.  Cardiovascular:     Rate and Rhythm: Normal rate and regular rhythm.     Pulses: Normal pulses.     Heart sounds: Normal heart sounds. No murmur heard.    No friction rub. No gallop.  Pulmonary:     Effort: Pulmonary effort is normal. No respiratory distress.     Breath sounds: Normal breath sounds. No wheezing or rales.  Skin:    General: Skin is warm and dry.  Neurological:     Mental Status: He is alert and oriented to person, place, and time. Mental status is at baseline.  Psychiatric:        Mood and Affect: Mood normal.        Behavior: Behavior normal.        Thought Content: Thought content normal.        Judgment: Judgment normal.       Results:   Studies obtained and personally reviewed by me:  Colonoscopy done by Dr. Julio Alm in 2014 with tubular adenoma being removed from the ascending colon.  Had repeat colonoscopy in 2022 with 3 tubular adenomas being removed and 1 hyperplastic polyp.  Repeat study due 2025.  Labs:       Component Value Date/Time   NA 141 01/29/2023 1250   NA 141 07/15/2021 0000   K 4.9 01/29/2023 1250   CL 104 01/29/2023 1250   CO2 30 01/29/2023 1250   GLUCOSE 102 (H) 01/29/2023 1250   BUN 17 01/29/2023 1250   BUN 24 (A) 07/15/2021 0000   CREATININE 0.72 01/29/2023 1250   CALCIUM 9.6 01/29/2023 1250   PROT 6.4 01/29/2023 1250   ALBUMIN 4.1 09/12/2016 0933   AST 14 01/29/2023 1250   ALT 15 01/29/2023 1250   ALKPHOS 60 09/12/2016 0933   BILITOT  0.8 01/29/2023 1250   GFRNONAA 86 11/21/2019 0929   GFRAA 100 11/21/2019 0929     Lab Results  Component Value Date   WBC 4.9 01/29/2023   HGB 16.7 01/29/2023   HCT 48.7 01/29/2023   MCV 94.6 01/29/2023   PLT 237 01/29/2023    Lab Results  Component Value Date  CHOL 165 01/29/2023   HDL 57 01/29/2023   LDLCALC 93 01/29/2023   TRIG 60 01/29/2023   CHOLHDL 2.9 01/29/2023    Lab Results  Component Value Date   HGBA1C 5.0 07/15/2021     Lab Results  Component Value Date   TSH 1.91 07/15/2021     Lab Results  Component Value Date   PSA 2.19 01/29/2023   PSA 5.09 (H) 01/20/2022   PSA 4.33 (H) 12/02/2020      Assessment & Plan:   Vitamin D deficiency: takes Vitamin D supplement.  Benign prostatic hyperplasia with urinary obstruction: treated with finasteride 5 mg daily, daily tadalafil, tamsulosin. Seen by urologist, Dr. Marcha Solders. PSA at 2.19.  Hypertension: blood pressure stable at 118/62 today.  Hyperlipidemia: lipid panel normal.  Colonoscopy done by Dr. Julio Alm in 2014 with tubular adenoma being removed from the ascending colon.  Had repeat colonoscopy in 2022 with 3 tubular adenomas being removed and 1 hyperplastic polyp.  Repeat study due 2025.  Prostate exam deferred to urologist.  Vaccine counseling: UTD on high-dose flu, pneumococcal 20 vaccines.  Return in 1 year or as needed.    I,Alexander Ruley,acting as a Neurosurgeon for Margaree Mackintosh, MD.,have documented all relevant documentation on the behalf of Margaree Mackintosh, MD,as directed by  Margaree Mackintosh, MD while in the presence of Margaree Mackintosh, MD.   I, Margaree Mackintosh, MD, have reviewed all documentation for this visit. The documentation on 02/02/23 for the exam, diagnosis, procedures, and orders are all accurate and complete.

## 2023-02-02 NOTE — Patient Instructions (Addendum)
It was a pleasure to see you today. Labs are stable. Return in one year or as needed.   Next appointment: Follow up in one year for your annual wellness visit. 02/04/24  Preventive Care 65 Years and Older, Male Preventive care refers to lifestyle choices and visits with your health care provider that can promote health and wellness. What does preventive care include? A yearly physical exam. This is also called an annual well check. Dental exams once or twice a year. Routine eye exams. Ask your health care provider how often you should have your eyes checked. Personal lifestyle choices, including: Daily care of your teeth and gums. Regular physical activity. Eating a healthy diet. Avoiding tobacco and drug use. Limiting alcohol use. Practicing safe sex. Taking low doses of aspirin every day. Taking vitamin and mineral supplements as recommended by your health care provider. What happens during an annual well check? The services and screenings done by your health care provider during your annual well check will depend on your age, overall health, lifestyle risk factors, and family history of disease. Counseling  Your health care provider may ask you questions about your: Alcohol use. Tobacco use. Drug use. Emotional well-being. Home and relationship well-being. Sexual activity. Eating habits. History of falls. Memory and ability to understand (cognition). Work and work Astronomer. Screening  You may have the following tests or measurements: Height, weight, and BMI. Blood pressure. Lipid and cholesterol levels. These may be checked every 5 years, or more frequently if you are over 58 years old. Skin check. Lung cancer screening. You may have this screening every year starting at age 2 if you have a 30-pack-year history of smoking and currently smoke or have quit within the past 15 years. Fecal occult blood test (FOBT) of the stool. You may have this test every year starting at  age 32. Flexible sigmoidoscopy or colonoscopy. You may have a sigmoidoscopy every 5 years or a colonoscopy every 10 years starting at age 58. Prostate cancer screening. Recommendations will vary depending on your family history and other risks. Hepatitis C blood test. Hepatitis B blood test. Sexually transmitted disease (STD) testing. Diabetes screening. This is done by checking your blood sugar (glucose) after you have not eaten for a while (fasting). You may have this done every 1-3 years. Abdominal aortic aneurysm (AAA) screening. You may need this if you are a current or former smoker. Osteoporosis. You may be screened starting at age 58 if you are at high risk. Talk with your health care provider about your test results, treatment options, and if necessary, the need for more tests. Vaccines  Your health care provider may recommend certain vaccines, such as: Influenza vaccine. This is recommended every year. Tetanus, diphtheria, and acellular pertussis (Tdap, Td) vaccine. You may need a Td booster every 10 years. Zoster vaccine. You may need this after age 16. Pneumococcal 13-valent conjugate (PCV13) vaccine. One dose is recommended after age 34. Pneumococcal polysaccharide (PPSV23) vaccine. One dose is recommended after age 30. Talk to your health care provider about which screenings and vaccines you need and how often you need them. This information is not intended to replace advice given to you by your health care provider. Make sure you discuss any questions you have with your health care provider. Document Released: 02/19/2015 Document Revised: 10/13/2015 Document Reviewed: 11/24/2014 Elsevier Interactive Patient Education  2017 [employer].  Fall Prevention in the Home Falls can cause injuries. They can happen to people of all ages. There are  many things you can do to make your home safe and to help prevent falls. What can I do on the outside of my home? Regularly fix the edges  of walkways and driveways and fix any cracks. Remove anything that might make you trip as you walk through a door, such as a raised step or threshold. Trim any bushes or trees on the path to your home. Use bright outdoor lighting. Clear any walking paths of anything that might make someone trip, such as rocks or tools. Regularly check to see if handrails are loose or broken. Make sure that both sides of any steps have handrails. Any raised decks and porches should have guardrails on the edges. Have any leaves, snow, or ice cleared regularly. Use sand or salt on walking paths during winter. Clean up any spills in your garage right away. This includes oil or grease spills. What can I do in the bathroom? Use night lights. Install grab bars by the toilet and in the tub and shower. Do not use towel bars as grab bars. Use non-skid mats or decals in the tub or shower. If you need to sit down in the shower, use a plastic, non-slip stool. Keep the floor dry. Clean up any water that spills on the floor as soon as it happens. Remove soap buildup in the tub or shower regularly. Attach bath mats securely with double-sided non-slip rug tape. Do not have throw rugs and other things on the floor that can make you trip. What can I do in the bedroom? Use night lights. Make sure that you have a light by your bed that is easy to reach. Do not use any sheets or blankets that are too big for your bed. They should not hang down onto the floor. Have a firm chair that has side arms. You can use this for support while you get dressed. Do not have throw rugs and other things on the floor that can make you trip. What can I do in the kitchen? Clean up any spills right away. Avoid walking on wet floors. Keep items that you use a lot in easy-to-reach places. If you need to reach something above you, use a strong step stool that has a grab bar. Keep electrical cords out of the way. Do not use floor polish or wax that  makes floors slippery. If you must use wax, use non-skid floor wax. Do not have throw rugs and other things on the floor that can make you trip. What can I do with my stairs? Do not leave any items on the stairs. Make sure that there are handrails on both sides of the stairs and use them. Fix handrails that are broken or loose. Make sure that handrails are as long as the stairways. Check any carpeting to make sure that it is firmly attached to the stairs. Fix any carpet that is loose or worn. Avoid having throw rugs at the top or bottom of the stairs. If you do have throw rugs, attach them to the floor with carpet tape. Make sure that you have a light switch at the top of the stairs and the bottom of the stairs. If you do not have them, ask someone to add them for you. What else can I do to help prevent falls? Wear shoes that: Do not have high heels. Have rubber bottoms. Are comfortable and fit you well. Are closed at the toe. Do not wear sandals. If you use a stepladder: Make sure that  it is fully opened. Do not climb a closed stepladder. Make sure that both sides of the stepladder are locked into place. Ask someone to hold it for you, if possible. Clearly mark and make sure that you can see: Any grab bars or handrails. First and last steps. Where the edge of each step is. Use tools that help you move around (mobility aids) if they are needed. These include: Canes. Walkers. Scooters. Crutches. Turn on the lights when you go into a dark area. Replace any light bulbs as soon as they burn out. Set up your furniture so you have a clear path. Avoid moving your furniture around. If any of your floors are uneven, fix them. If there are any pets around you, be aware of where they are. Review your medicines with your doctor. Some medicines can make you feel dizzy. This can increase your chance of falling. Ask your doctor what other things that you can do to help prevent falls. This  information is not intended to replace advice given to you by your health care provider. Make sure you discuss any questions you have with your health care provider. Document Released: 11/19/2008 Document Revised: 07/01/2015 Document Reviewed: 02/27/2014 Elsevier Interactive Patient Education  2017 [employer].

## 2023-02-02 NOTE — Progress Notes (Signed)
Subjective:   Luisdavid P Kisean Arceneaux. is a 77 y.o. male who presents for Medicare Annual/Subsequent preventive examination.  Visit Complete: In person  Patient Medicare AWV questionnaire was completed by the patient on 02/02/23; I have confirmed that all information answered by patient is correct and no changes since this date.  Cardiac Risk Factors include: advanced age (>59men, >26 women);hypertension;male gender     Objective:    Today's Vitals   02/02/23 1040  BP: 118/62  Pulse: 70  SpO2: 96%  Weight: 247 lb (112 kg)  Height: 6\' 2"  (1.88 m)  PainSc: 0-No pain   Body mass index is 31.71 kg/m.     01/23/2022   11:05 AM 12/03/2020   10:17 AM  Advanced Directives  Does Patient Have a Medical Advance Directive? Yes Yes  Type of Estate agent of [place];Living will Healthcare Power of [place];Living will  Does patient want to make changes to medical advance directive? No - Patient declined No - Patient declined  Copy of Healthcare Power of Attorney in Chart? No - copy requested No - copy requested    Current Medications (verified) Outpatient Encounter Medications as of 02/02/2023  Medication Sig   finasteride (PROSCAR) 5 MG tablet Take 1 tablet (5 mg total) by mouth daily.   fluticasone (FLONASE) 50 MCG/ACT nasal spray Place into the nose.   tadalafil (CIALIS) 5 MG tablet Take 1 tablet (5 mg total) by mouth daily.   tamsulosin (FLOMAX) 0.4 MG CAPS capsule Take 1 capsule (0.4 mg total) by mouth daily. Take at same time each day following a meal   triamcinolone cream (KENALOG) 0.1 % Apply 1 application topically daily as needed.   valACYclovir (VALTREX) 500 MG tablet TAKE 1 TABLET BY MOUTH EVERY DAY   VITAMIN D, CHOLECALCIFEROL, PO Take by mouth.   No facility-administered encounter medications on file as of 02/02/2023.    Allergies (verified) Celebrex [celecoxib], Cephalosporins, and Cephalexin   History: Past Medical History:  Diagnosis Date    Allergy    Atypical mole    BCC (basal cell carcinoma of skin) 11/20/2017   right ear rim Mohs   Dependent edema    Granuloma annulare    Hyperlipidemia    Hypertension    Vitamin D deficiency    Past Surgical History:  Procedure Laterality Date   Birthmark removed  02/06/1946   at [facility]   CATARACT EXTRACTION, BILATERAL  04/25/2012   CERVICAL DISC SURGERY  02/06/2002   COLONOSCOPY  06/03/2002   "scope not long enough" per pt.   KNEE SURGERY  02/06/2006   NASAL SEPTUM SURGERY     RETINAL DETACHMENT SURGERY Right 2017   SHOULDER ARTHROSCOPY  04/29/2009   subacromial decompression, labral debridement, rotator cuff debridement, open reconstruction of complex rotator interval/supraspinatus rotator cuff tear   TONSILLECTOMY     Family History  Problem Relation Age of Onset   Heart disease Father    Diabetes Father    Hypertension Father    Kidney disease Sister    Colon cancer Neg Hx    Colon polyps Neg Hx    Esophageal cancer Neg Hx    Rectal cancer Neg Hx    Stomach cancer Neg Hx    Social History   Socioeconomic History   Marital status: Married    Spouse name: Not on file   Number of children: Not on file   Years of education: Not on file   Highest education level: Not on file  Occupational  History   Not on file  Tobacco Use   Smoking status: Former    Current packs/day: 0.00    Types: Cigarettes    Quit date: 02/06/1997    Years since quitting: 26.0   Smokeless tobacco: Never  Vaping Use   Vaping status: Never Used  Substance and Sexual Activity   Alcohol use: Yes    Alcohol/week: 7.0 standard drinks of alcohol    Types: 7 Glasses of wine per week   Drug use: No   Sexual activity: Yes  Other Topics Concern   Not on file  Social History Narrative   Not on file   Social Drivers of Health   Financial Resource Strain: Low Risk  (02/02/2023)   Overall Financial Resource Strain (CARDIA)    Difficulty of Paying Living Expenses: Not hard at all  Food  Insecurity: No Food Insecurity (02/02/2023)   Hunger Vital Sign    Worried About Running Out of Food in the Last Year: Never true    Ran Out of Food in the Last Year: Never true  Transportation Needs: No Transportation Needs (01/25/2023)   PRAPARE - Administrator, Civil Service (Medical): No    Lack of Transportation (Non-Medical): No  Physical Activity: Sufficiently Active (02/02/2023)   Exercise Vital Sign    Days of Exercise per Week: 3 days    Minutes of Exercise per Session: 60 min  Stress: No Stress Concern Present (02/02/2023)   [employer] of Occupational Health - Occupational Stress Questionnaire    Feeling of Stress : Not at all  Social Connections: Socially Integrated (02/02/2023)   Social Connection and Isolation Panel [NHANES]    Frequency of Communication with Friends and Family: More than three times a week    Frequency of Social Gatherings with Friends and Family: More than three times a week    Attends Religious Services: More than 4 times per year    Active Member of [employer] or Organizations: Yes    Attends Engineer, structural: More than 4 times per year    Marital Status: Married    Tobacco Counseling Counseling given: Not Answered   Clinical Intake:  Pre-visit preparation completed: Yes  Pain : No/denies pain Pain Score: 0-No pain     BMI - recorded: 31.71 Nutritional Status: BMI > 30  Obese Nutritional Risks: None Diabetes: No  How often do you need to have someone help you when you read instructions, pamphlets, or other written materials from your doctor or pharmacy?: 1 - Never  Interpreter Needed?: No  Information entered by :: Dixie Dials, CMA   Activities of Daily Living    02/02/2023   10:40 AM 01/25/2023   10:55 AM  In your present state of health, do you have any difficulty performing the following activities:  Hearing? 0 0  Vision? 0 0  Difficulty concentrating or making decisions? 0 0  Walking or  climbing stairs? 0 0  Dressing or bathing? 0 0  Doing errands, shopping? 0 0  Preparing Food and eating ? N N  Using the Toilet? N N  In the past six months, have you accidently leaked urine? N N  Do you have problems with loss of bowel control? N N  Managing your Medications? N N  Managing your Finances? N N  Housekeeping or managing your Housekeeping? N N    Patient Care Team: Margaree Mackintosh, MD as PCP - General (Internal Medicine) Janalyn Harder, MD (Inactive) as Consulting Physician (  Dermatology) Wynona Canes, MD as Referring Physician (Urology)  Indicate any recent Medical Services you may have received from other than [facility] providers in the past year (date may be approximate).     Assessment:   This is a routine wellness examination for Ta[place].  Hearing/Vision screen No results found.   Goals Addressed   None    Depression Screen    02/02/2023   10:48 AM 01/23/2022   11:05 AM 12/23/2021    4:08 PM 12/03/2020   10:18 AM 12/01/2019   10:09 AM 11/22/2017   11:04 AM 09/15/2016   10:11 AM  PHQ 2/9 Scores  PHQ - 2 Score 0 0 0 0 0 0 0    Fall Risk    02/02/2023   10:47 AM 01/25/2023   10:55 AM 01/23/2022   11:04 AM 12/23/2021    4:08 PM 12/03/2020   10:17 AM  Fall Risk   Falls in the past year? 0 0 0 0 0  Number falls in past yr: 0 0 0 0 0  Injury with Fall? 0 0 0 0 0  Risk for fall due to : No Fall Risks  No Fall Risks No Fall Risks No Fall Risks  Follow up Education provided;Falls evaluation completed;Falls prevention discussed  Falls prevention discussed Falls evaluation completed Falls evaluation completed    MEDICARE RISK AT HOME: Medicare Risk at Home Any stairs in or around the home?: Yes If so, are there any without handrails?: No Home free of loose throw rugs in walkways, pet beds, electrical cords, etc?: No Adequate lighting in your home to reduce risk of falls?: Yes Life alert?: No Use of a cane, walker or w/c?: No Grab bars in the  bathroom?: Yes Shower chair or bench in shower?: Yes Elevated toilet seat or a handicapped toilet?: No  TIMED UP AND GO:  Was the test performed?  No    Cognitive Function:        02/02/2023   10:48 AM 01/23/2022   11:06 AM 12/03/2020   10:20 AM  6CIT Screen  What Year? 0 points 0 points 0 points  What month? 0 points 0 points 0 points  What time? 0 points 0 points 0 points  Count back from 20 0 points 0 points 0 points  Months in reverse 0 points 0 points 0 points  Repeat phrase 0 points 10 points 0 points  Total Score 0 points 10 points 0 points    Immunizations Immunization History  Administered Date(s) Administered   Influenza, High Dose Seasonal PF 12/12/2022   Influenza,inj,Quad PF,6+ Mos 11/13/2019   Influenza-Unspecified 11/03/2015, 11/12/2020, 11/29/2021   Moderna SARS-COV2 Booster Vaccination 11/12/2020   Moderna Sars-Covid-2 Vaccination 10/04/2018, 11/01/2018, 10/04/2019, 11/01/2019   PNEUMOCOCCAL CONJUGATE-20 01/23/2022   Pneumococcal Conjugate-13 08/18/2014   Pneumococcal Polysaccharide-23 03/20/2011   Tdap 03/04/2002, 03/29/2012    TDAP status: Due, Education has been provided regarding the importance of this vaccine. Advised may receive this vaccine at local pharmacy or Health Dept. Aware to provide a copy of the vaccination record if obtained from local pharmacy or Health Dept. Verbalized acceptance and understanding.  Flu Vaccine status: Up to date  Pneumococcal vaccine status: Up to date  Covid-19 vaccine status: Declined, Education has been provided regarding the importance of this vaccine but patient still declined. Advised may receive this vaccine at local pharmacy or Health Dept.or vaccine clinic. Aware to provide a copy of the vaccination record if obtained from local pharmacy or Health Dept. Verbalized acceptance  and understanding.  Qualifies for Shingles Vaccine? No   Zostavax completed No   Shingrix Completed?: No.    Education has been  provided regarding the importance of this vaccine. Patient has been advised to call insurance company to determine out of pocket expense if they have not yet received this vaccine. Advised may also receive vaccine at local pharmacy or Health Dept. Verbalized acceptance and understanding.  Screening Tests Health Maintenance  Topic Date Due   DTaP/Tdap/Td (3 - Td or Tdap) 02/02/2024 (Originally 03/29/2022)   Colonoscopy  07/29/2023   Medicare Annual Wellness (AWV)  01/29/2024   Pneumonia Vaccine 30+ Years old  Completed   INFLUENZA VACCINE  Completed   HPV VACCINES  Aged Out   COVID-19 Vaccine  Discontinued   Hepatitis C Screening  Discontinued   Zoster Vaccines- Shingrix  Discontinued    Health Maintenance  There are no preventive care reminders to display for this patient.  Colorectal cancer screening: Type of screening: Colonoscopy. Completed 07/28/20. Repeat every 3 years  Lung Cancer Screening: (Low Dose CT Chest recommended if Age 5-80 years, 20 pack-year currently smoking OR have quit w/in 15years.) does not qualify.    Additional Screening:  Hepatitis C Screening: does not qualify; Completed   Vision Screening: Recommended annual ophthalmology exams for early detection of glaucoma and other disorders of the eye. Is the patient up to date with their annual eye exam?  Yes  Who is the provider or what is the name of the office in which the patient attends annual eye exams?  If pt is not established with a provider, would they like to be referred to a provider to establish care? No .   Dental Screening: Recommended annual dental exams for proper oral hygiene  Community Resource Referral / Chronic Care Management: CRR required this visit?  No   CCM required this visit?  No     Plan:     I have personally reviewed and noted the following in the patient's chart:   Medical and social history Use of alcohol, tobacco or illicit drugs  Current medications and supplements  including opioid prescriptions. Patient is not currently taking opioid prescriptions. Functional ability and status Nutritional status Physical activity Advanced directives List of other physicians Hospitalizations, surgeries, and ER visits in previous 12 months Vitals Screenings to include cognitive, depression, and falls Referrals and appointments  In addition, I have reviewed and discussed with patient certain preventive protocols, quality metrics, and best practice recommendations. A written personalized care plan for preventive services as well as general preventive health recommendations were provided to patient.     Araceli Sharlyne Cai, CMA   02/02/2023   After Visit Summary: (In Person-Printed) AVS printed and given to the patient   He is also here for an office visit as we have not seen him since December 2023. His insurance does not cover an annual physical exam. He is switching to [facility] for next year. He feels well but has had some knee pain and had injection of knee by Dr. Eliberto Ivory PA. Continues to be followed by Dr. Gaynell Face hall for BPH and is on Proscar and Tamsulosin. Physically active. VS reviewed. BP is normal. Weight is 247 pounds about 9 pounds heavier than this time l;ast year.  Chest clear to auscultation. Cor:RRR Ext without pitting edema.  Left knee osteoarthritis BPH  Plan: Lipid panel, PSA and CBC are all WNL.C-met normal except for very slightly elevated fasting glucose of 102.  RTC in one year or  as needed. Seems to be doing well.

## 2023-02-20 ENCOUNTER — Encounter: Payer: Self-pay | Admitting: Urology

## 2023-02-20 ENCOUNTER — Ambulatory Visit (INDEPENDENT_AMBULATORY_CARE_PROVIDER_SITE_OTHER): Payer: Medicare Other | Admitting: Urology

## 2023-02-20 VITALS — BP 161/80 | HR 73 | Ht 74.0 in | Wt 240.0 lb

## 2023-02-20 DIAGNOSIS — N529 Male erectile dysfunction, unspecified: Secondary | ICD-10-CM | POA: Diagnosis not present

## 2023-02-20 DIAGNOSIS — R972 Elevated prostate specific antigen [PSA]: Secondary | ICD-10-CM

## 2023-02-20 DIAGNOSIS — N138 Other obstructive and reflux uropathy: Secondary | ICD-10-CM

## 2023-02-20 DIAGNOSIS — N401 Enlarged prostate with lower urinary tract symptoms: Secondary | ICD-10-CM

## 2023-02-20 MED ORDER — TADALAFIL 5 MG PO TABS: 5.0000 mg | ORAL_TABLET | Freq: Every day | ORAL | 3 refills | Status: DC

## 2023-02-20 MED ORDER — FINASTERIDE 5 MG PO TABS: 5.0000 mg | ORAL_TABLET | Freq: Every day | ORAL | 3 refills | Status: DC

## 2023-02-20 MED ORDER — TAMSULOSIN HCL 0.4 MG PO CAPS: 0.4000 mg | ORAL_CAPSULE | Freq: Every day | ORAL | 3 refills | Status: DC

## 2023-02-20 NOTE — Progress Notes (Signed)
 Assessment: 1. Benign prostatic hyperplasia with urinary obstruction   2. Erectile dysfunction of organic origin   3. Elevated PSA     Plan: Continue current meds-prescriptions renewed for finasteride , tamsulosin , and Cialis . Follow-up 1 year or sooner if problems arise PSA on follow-up  Chief Complaint: Prostate issues  HPI: Harold P Renaldo Gornick. is a 78 y.o. male who presents for continued evaluation of BPH/lower urinary tract symptoms, elevated psa and erectile dysfunction. See my note 04/2022 at the time of initial visit for detailed history and exam. Harold [name] has a long history of BPH and borderline PSA/normal ade-adjusted and has been followed for a number of years by Dr. Alfonzo most recently at [facility] last visit 01/2021. Patient most recently over the last few years has been taking combination therapy with daily tadalafil  and tamsulosin .  Tamsulosin  by itself was not effective.  Patient is also benefited in terms of erectile function while taking the daily tadalafil . At the time of his visit 04/2022 he reported worsening lower urinary tract symptoms.  IPSS at that time was 14.  We discussed additional medical therapy and he was started on finasteride  for combination therapy. Presently, Harold [name] reports that his lower urinary tract symptoms have improved.  Current IPSS = 8. PSA noted to have fallen appropriately   Patient denies other urologic complaints including hematuria. No family history of prostate cancer     PSA data: 09/2016                         3.3 11/2017                       3.9 11/2018                       3.2 11/2019                       3.71 11/2020                       4.33 01/2022                       5.09  started finasteride  04/2022 01/2023  2.19  Portions of the above documentation were copied from a prior visit for review purposes only.  Allergies: Allergies  Allergen Reactions   Celebrex [Celecoxib]    Cephalosporins    Cephalexin Rash     PMH: Past Medical History:  Diagnosis Date   Allergy    Atypical mole    BCC (basal cell carcinoma of skin) 11/20/2017   right ear rim Mohs   Dependent edema    Granuloma annulare    Hyperlipidemia    Hypertension    Vitamin D deficiency     PSH: Past Surgical History:  Procedure Laterality Date   Birthmark removed  02/06/1946   at [facility]   CATARACT EXTRACTION, BILATERAL  04/25/2012   CERVICAL DISC SURGERY  02/06/2002   COLONOSCOPY  06/03/2002   scope not long enough per pt.   KNEE SURGERY  02/06/2006   NASAL SEPTUM SURGERY     RETINAL DETACHMENT SURGERY Right 2017   SHOULDER ARTHROSCOPY  04/29/2009   subacromial decompression, labral debridement, rotator cuff debridement, open reconstruction of complex rotator interval/supraspinatus rotator cuff tear   TONSILLECTOMY      SH: Social History   Tobacco Use   Smoking status:  Former    Current packs/day: 0.00    Types: Cigarettes    Quit date: 02/06/1997    Years since quitting: 26.0   Smokeless tobacco: Never  Vaping Use   Vaping status: Never Used  Substance Use Topics   Alcohol use: Yes    Alcohol/week: 7.0 standard drinks of alcohol    Types: 7 Glasses of wine per week   Drug use: No    ROS: Constitutional:  Negative for fever, chills, weight loss CV: Negative for chest pain, previous MI, hypertension Respiratory:  Negative for shortness of breath, wheezing, sleep apnea, frequent cough GI:  Negative for nausea, vomiting, bloody stool, GERD  PE: BP (!) 161/80   Pulse 73   Ht 6' 2 (1.88 m)   Wt 240 lb (108.9 kg)   BMI 30.81 kg/m  GENERAL APPEARANCE:  Well appearing, well developed, well nourished, NAD

## 2023-02-26 ENCOUNTER — Telehealth: Payer: Medicare Other | Admitting: Physician Assistant

## 2023-02-26 DIAGNOSIS — J101 Influenza due to other identified influenza virus with other respiratory manifestations: Secondary | ICD-10-CM | POA: Diagnosis not present

## 2023-02-26 MED ORDER — OSELTAMIVIR PHOSPHATE 75 MG PO CAPS: 75.0000 mg | ORAL_CAPSULE | Freq: Two times a day (BID) | ORAL | 0 refills | Status: DC

## 2023-02-26 MED ORDER — BENZONATATE 100 MG PO CAPS: 100.0000 mg | ORAL_CAPSULE | Freq: Three times a day (TID) | ORAL | 0 refills | Status: DC | PRN

## 2023-02-26 NOTE — Progress Notes (Signed)
E visit for Flu like symptoms   We are sorry that you are not feeling well.  Here is how we plan to help! Based on what you have shared with me it looks like you may have a respiratory virus that may be influenza.  Influenza or "the flu" is   an infection caused by a respiratory virus. The flu virus is highly contagious and persons who did not receive their yearly flu vaccination may "catch" the flu from close contact.  We have anti-viral medications to treat the viruses that cause this infection. They are not a "cure" and only shorten the course of the infection. These prescriptions are most effective when they are given within the first 2 days of "flu" symptoms. Antiviral medication are indicated if you have a high risk of complications from the flu. You should  also consider an antiviral medication if you are in close contact with someone who is at risk. These medications can help patients avoid complications from the flu  but have side effects that you should know. Possible side effects from Tamiflu or oseltamivir include nausea, vomiting, diarrhea, dizziness, headaches, eye redness, sleep problems or other respiratory symptoms. You should not take Tamiflu if you have an allergy to oseltamivir or any to the ingredients in Tamiflu.  Based upon your symptoms and potential risk factors I have prescribed Oseltamivir (Tamiflu).  It has been sent to your designated pharmacy.  You will take one 75 mg capsule orally twice a day for the next 5 days.  I have also prescribed Tessalon perles 100mg  Take 1 capsule every 8 hours as needed for cough.  ANYONE WHO HAS FLU SYMPTOMS SHOULD: Stay home. The flu is highly contagious and going out or to work exposes others! Be sure to drink plenty of fluids. Water is fine as well as fruit juices, sodas and electrolyte beverages. You may want to stay away from caffeine or alcohol. If you are nauseated, try taking small sips of liquids. How do you know if you are getting  enough fluid? Your urine should be a pale yellow or almost colorless. Get rest. Taking a steamy shower or using a humidifier may help nasal congestion and ease sore throat pain. Using a saline nasal spray works much the same way. Cough drops, hard candies and sore throat lozenges may ease your cough. Line up a caregiver. Have someone check on you regularly.   GET HELP RIGHT AWAY IF: You cannot keep down liquids or your medications. You become short of breath Your fell like you are going to pass out or loose consciousness. Your symptoms persist after you have completed your treatment plan MAKE SURE YOU  Understand these instructions. Will watch your condition. Will get help right away if you are not doing well or get worse.  Your e-visit answers were reviewed by a board certified advanced clinical practitioner to complete your personal care plan.  Depending on the condition, your plan could have included both over the counter or prescription medications.  If there is a problem please reply  once you have received a response from your provider.  Your safety is important to [place].  If you have drug allergies check your prescription carefully.    You can use MyChart to ask questions about today's visit, request a non-urgent call back, or ask for a work or school excuse for 24 hours related to this e-Visit. If it has been greater than 24 hours you will need to follow up with your provider,  or enter a new e-Visit to address those concerns.  You will get an e-mail in the next two days asking about your experience.  I hope that your e-visit has been valuable and will speed your recovery. Thank you for using e-visits.    I have spent 5 minutes in review of e-visit questionnaire, review and updating patient chart, medical decision making and response to patient.   Margaretann Loveless, PA-C

## 2023-04-12 LAB — LAB REPORT - SCANNED
A1c: 5.1
EGFR: 91
TSH: 1.55 (ref 0.41–5.90)

## 2023-05-28 ENCOUNTER — Other Ambulatory Visit: Payer: Self-pay | Admitting: Urology

## 2023-05-28 ENCOUNTER — Encounter: Payer: Self-pay | Admitting: Urology

## 2023-05-28 DIAGNOSIS — N529 Male erectile dysfunction, unspecified: Secondary | ICD-10-CM

## 2023-05-28 DIAGNOSIS — N138 Other obstructive and reflux uropathy: Secondary | ICD-10-CM

## 2023-05-28 MED ORDER — TADALAFIL 5 MG PO TABS: 5.0000 mg | ORAL_TABLET | Freq: Every day | ORAL | 3 refills | Status: DC

## 2023-08-21 ENCOUNTER — Ambulatory Visit: Payer: Self-pay | Admitting: Internal Medicine

## 2023-10-31 ENCOUNTER — Ambulatory Visit: Payer: Self-pay

## 2023-10-31 NOTE — Telephone Encounter (Signed)
 FYI Only or Action Required?: FYI only for provider.  Patient was last seen in primary care on 02/02/2023 by Perri Ronal PARAS, MD.  Called Nurse Triage reporting Hypertension.  Symptoms began yesterday.  Interventions attempted: Nothing.  Symptoms are: stable.  Triage Disposition: See PCP When Office is Open (Within 3 Days)  Patient/caregiver understands and will follow disposition?: Yes  **Appt. Scheduled for 9/25**         Copied from CRM #[id number]. Topic: Clinical - Red Word Triage >> Oct 31, 2023 10:49 AM Pinkey ORN wrote: Red Word that prompted transfer to Nurse Triage: High Blood Pressure >> Oct 31, 2023 10:51 AM Pinkey ORN wrote: Patient states his blood pressure is all over the place, last reading was 170/90. Patient states he's experiencing some difficulty sleeping and feeling some tension in his neck and back  Reason for Disposition  Systolic BP >= 160 OR Diastolic >= 100  Answer Assessment - Initial Assessment Questions 1. BLOOD PRESSURE: What is your blood pressure? Did you take at least two measurements 5 minutes apart?     Last  reading 150/70, received a reading of 170/90 last night   2. ONSET: When did you take your blood pressure?     Yesterday   3. HOW: How did you take your blood pressure? (e.g., automatic home BP monitor, visiting nurse)      automatic home BP monitor  4. HISTORY: Do you have a history of high blood pressure?     No dx. Of  HTN  5. MEDICINES: Are you taking any medicines for blood pressure? Have you missed any doses recently?     No   6. OTHER SYMPTOMS: Do you have any symptoms? (e.g., blurred vision, chest pain, difficulty breathing, headache, weakness)  No.   Patient would like to follow-up with PCP regarding higher B/P. Appt. Scheduled for 9/25  Protocols used: Blood Pressure - High-A-AH

## 2023-11-01 ENCOUNTER — Encounter: Payer: Self-pay | Admitting: Internal Medicine

## 2023-11-01 ENCOUNTER — Ambulatory Visit: Admitting: Internal Medicine

## 2023-11-01 VITALS — BP 130/70 | HR 92 | Ht 74.0 in

## 2023-11-01 DIAGNOSIS — R5383 Other fatigue: Secondary | ICD-10-CM | POA: Diagnosis not present

## 2023-11-01 DIAGNOSIS — I10 Essential (primary) hypertension: Secondary | ICD-10-CM | POA: Diagnosis not present

## 2023-11-01 DIAGNOSIS — Z96642 Presence of left artificial hip joint: Secondary | ICD-10-CM | POA: Diagnosis not present

## 2023-11-01 DIAGNOSIS — Z860101 Personal history of adenomatous and serrated colon polyps: Secondary | ICD-10-CM

## 2023-11-01 DIAGNOSIS — M48062 Spinal stenosis, lumbar region with neurogenic claudication: Secondary | ICD-10-CM

## 2023-11-01 DIAGNOSIS — G609 Hereditary and idiopathic neuropathy, unspecified: Secondary | ICD-10-CM

## 2023-11-01 DIAGNOSIS — N400 Enlarged prostate without lower urinary tract symptoms: Secondary | ICD-10-CM

## 2023-11-01 MED ORDER — AMLODIPINE BESYLATE 5 MG PO TABS: 5.0000 mg | ORAL_TABLET | Freq: Every day | ORAL | 0 refills | Status: AC

## 2023-11-01 MED ORDER — ALPRAZOLAM 0.5 MG PO TABS: 0.5000 mg | ORAL_TABLET | Freq: Two times a day (BID) | ORAL | 1 refills | Status: DC | PRN

## 2023-11-01 NOTE — Progress Notes (Signed)
 Patient Care Team: Harold Harold PARAS, MD as PCP - General (Internal Medicine) Harold Rigg, MD as Consulting Physician (Dermatology) Harold Jerilynn LABOR, MD as Referring Physician (Urology)  Visit Date: 11/01/23  Subjective:    Patient ID: Harold [name]. , Male   DOB: 1945/02/16, 78 y.o.    MRN: [id number]   78 y.o. Male presents today for an office visit for Hypertension. Patient has a past medical history of Hyperlipidemia, Vitamin D deficiency.     On 10/11/2023, Harold [name] had a left primary total hip arthroplasty due to primary osteoarthritis of the left hip at [facility].. 10 days before the surgery, his blood pressure was elevated so he was prescribed amlodipine . He has previously been on blood pressure medication. He admitted that he discontinued use of the amlodipine  for at least a week and his blood pressure is elevated again. It is suspected that his elevated blood pressure is due to the pain from the surgery. He was prescribed oxycodone 5 mg for the pain but admits that he does not take it because it gives him painful constipation and instead takes Tylenol. His blood pressure today is normal at 130/70. He also says that he is having trouble sleeping due to discomfort caused by the surgery and believes that it could be contributing to his blood pressure readings.    History of Vitamin D deficiency treated with Vitamin D supplements.   History of Hyperlipidemia which is untreated.   Past Medical History:  Diagnosis Date   Allergy    Atypical mole    BCC (basal cell carcinoma of skin) 11/20/2017   right ear rim Mohs   Dependent edema    Granuloma annulare    Hyperlipidemia    Hypertension    Vitamin D deficiency      Family History  Problem Relation Age of Onset   Heart disease Father    Diabetes Father    Hypertension Father    Kidney disease Sister    Colon cancer Neg Hx    Colon polyps Neg Hx    Esophageal cancer Neg Hx    Rectal cancer Neg  Hx    Stomach cancer Neg Hx   Father died at age 19 with MI with history of diabetes and hypertension. Mother died of complications of Alzheimer's disease. 1 brother in good health. 1 sister with nephrotic syndrome.    Social History Narrative:  He is retired.  He resides in [place].  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  He used to smoke but quit around 1997-12-16.  Social alcohol consumption.  Son deceased in Dec 16, 2016.  He has sons in [place] and [place] .  He enjoys golf.  He has a boat and does some fishing.        Review of Systems  All other systems reviewed and are negative.       Objective:   Vitals: BP 130/70   Pulse 92   Ht 6' 2 (1.88 m)   SpO2 98%   BMI 30.81 kg/m    Physical Exam Constitutional:      General: He is not in acute distress.    Appearance: Normal appearance. He is not ill-appearing.  HENT:     Head: Normocephalic and atraumatic.  Neck:     Vascular: No carotid bruit.  Cardiovascular:     Rate and Rhythm: Normal rate and regular rhythm.  Pulses: Normal pulses.     Heart sounds: Normal heart sounds. No murmur heard.    No friction rub. No gallop.  Pulmonary:     Effort: Pulmonary effort is normal. No respiratory distress.     Breath sounds: Normal breath sounds. No wheezing or rales.  Skin:    General: Skin is warm and dry.  Neurological:     Mental Status: He is alert and oriented to person, place, and time. Mental status is at baseline.  Psychiatric:        Mood and Affect: Mood normal.        Behavior: Behavior normal.        Thought Content: Thought content normal.        Judgment: Judgment normal.       Results:     Labs:       Component Value Date/Time   NA 141 01/29/2023 1250   NA 141 07/15/2021 0000   K 4.9 01/29/2023 1250   CL 104 01/29/2023 1250   CO2 30 01/29/2023 1250   GLUCOSE 102 (H) 01/29/2023 1250   BUN 17 01/29/2023 1250   BUN 24 (A) 07/15/2021  0000   CREATININE 0.72 01/29/2023 1250   CALCIUM 9.6 01/29/2023 1250   PROT 6.4 01/29/2023 1250   ALBUMIN 4.1 09/12/2016 0933   AST 14 01/29/2023 1250   ALT 15 01/29/2023 1250   ALKPHOS 60 09/12/2016 0933   BILITOT 0.8 01/29/2023 1250   GFRNONAA 86 11/21/2019 0929   GFRAA 100 11/21/2019 0929     Lab Results  Component Value Date   WBC 4.9 01/29/2023   HGB 16.7 01/29/2023   HCT 48.7 01/29/2023   MCV 94.6 01/29/2023   PLT 237 01/29/2023    Lab Results  Component Value Date   CHOL 165 01/29/2023   HDL 57 01/29/2023   LDLCALC 93 01/29/2023   TRIG 60 01/29/2023   CHOLHDL 2.9 01/29/2023    Lab Results  Component Value Date   HGBA1C 5.0 07/15/2021     Lab Results  Component Value Date   TSH 1.55 04/11/2023     Lab Results  Component Value Date   PSA 2.19 01/29/2023   PSA 5.09 (H) 01/20/2022   PSA 4.33 (H) 12/02/2020   Assessment & Plan:    Orders Placed This Encounter  Procedures   CBC with Differential/Platelet   Comprehensive metabolic panel with GFR   TSH      Essential Hypertension: On 10/11/2023 He had a left primary total hip arthroplasty due to primary osteoarthritis of the left hip.10 days before the surgery his blood pressure was elevated so he was prescribed amlodipine  10 mg daily. He has previously been on no blood pressure medication. He admitted that he discontinued use of the amlodipine  for at least a week and his blood pressure is elevated again. It is suspected that his elevated blood pressure is due to the pain from the surgery. He was prescribed oxycodone 5 mg for the pain but admits that he does not take it because it gives him painful constipation and instead takes Tylenol. His blood pressure today is normal at 130/70. He also says that he is having trouble sleeping due to discomfort caused by the surgery and believes that it could be contributing to his blood pressure.   Amlodipine  5 mg daily prescribed    Anxiety: Xanax  0.5 two times daily as  needed prescribed for anxiety.    Hx left hip arthroplasty on Sept 4th at [facility]  Harold [name],acting as a scribe for Harold JINNY Hailstone, MD.,have documented all relevant documentation on the behalf of Harold JINNY Hailstone, MD,as directed by  Harold JINNY Hailstone, MD while in the presence of Harold JINNY Hailstone, MD.   I, Harold JINNY Hailstone, MD, have reviewed all documentation for this visit. The documentation on 11/01/2023 for the exam, diagnosis, procedures, and orders are all accurate and complete.   I have reviewed and agree with the above Annual Wellness Visit documentation.  Harold Norleen Hailstone, MD. Internal Medicine 11/01/2023

## 2023-11-02 ENCOUNTER — Encounter: Payer: Self-pay | Admitting: Internal Medicine

## 2023-11-02 ENCOUNTER — Ambulatory Visit: Payer: Self-pay | Admitting: Internal Medicine

## 2023-11-02 LAB — COMPREHENSIVE METABOLIC PANEL WITH GFR
AG Ratio: 1.8 (calc) (ref 1.0–2.5)
ALT: 12 U/L (ref 9–46)
AST: 13 U/L (ref 10–35)
Albumin: 3.9 g/dL (ref 3.6–5.1)
Alkaline phosphatase (APISO): 84 U/L (ref 35–144)
BUN: 21 mg/dL (ref 7–25)
CO2: 23 mmol/L (ref 20–32)
Calcium: 9.4 mg/dL (ref 8.6–10.3)
Chloride: 103 mmol/L (ref 98–110)
Creat: 0.84 mg/dL (ref 0.70–1.28)
Globulin: 2.2 g/dL (ref 1.9–3.7)
Glucose, Bld: 94 mg/dL (ref 65–99)
Potassium: 4.5 mmol/L (ref 3.5–5.3)
Sodium: 135 mmol/L (ref 135–146)
Total Bilirubin: 0.5 mg/dL (ref 0.2–1.2)
Total Protein: 6.1 g/dL (ref 6.1–8.1)
eGFR: 90 mL/min/1.73m2 (ref >=60)

## 2023-11-02 LAB — CBC WITH DIFFERENTIAL/PLATELET
Absolute Lymphocytes: 1893 {cells}/uL (ref 850–3900)
Absolute Monocytes: 750 {cells}/uL (ref 200–950)
Basophils Absolute: 90 {cells}/uL (ref 0–200)
Basophils Relative: 0.8 %
Eosinophils Absolute: 381 {cells}/uL (ref 15–500)
Eosinophils Relative: 3.4 %
HCT: 38.5 % (ref 38.5–50.0)
Hemoglobin: 12.7 g/dL — ABNORMAL LOW (ref 13.2–17.1)
MCH: 32 pg (ref 27.0–33.0)
MCHC: 33 g/dL (ref 32.0–36.0)
MCV: 97 fL (ref 80.0–100.0)
MPV: 9.1 fL (ref 7.5–12.5)
Monocytes Relative: 6.7 %
Neutro Abs: 8086 {cells}/uL — ABNORMAL HIGH (ref 1500–7800)
Neutrophils Relative %: 72.2 %
Platelets: 478 Thousand/uL — ABNORMAL HIGH (ref 140–400)
RBC: 3.97 Million/uL — ABNORMAL LOW (ref 4.20–5.80)
RDW: 11.7 % (ref 11.0–15.0)
Total Lymphocyte: 16.9 %
WBC: 11.2 Thousand/uL — ABNORMAL HIGH (ref 3.8–10.8)

## 2023-11-02 LAB — TSH: TSH: 0.71 m[IU]/L (ref 0.40–4.50)

## 2023-11-04 NOTE — Patient Instructions (Addendum)
 We are sorry you are not feeling well. Take amlodipine  5 mg daily and monitor BP. Labs drawn and are pending. Stay well hydrated.

## 2023-11-29 ENCOUNTER — Other Ambulatory Visit: Payer: Self-pay | Admitting: Internal Medicine

## 2023-12-10 ENCOUNTER — Encounter: Payer: Self-pay | Admitting: Radiology

## 2023-12-20 ENCOUNTER — Other Ambulatory Visit: Payer: Self-pay | Admitting: Urology

## 2024-01-07 ENCOUNTER — Telehealth: Payer: Self-pay | Admitting: Internal Medicine

## 2024-01-07 ENCOUNTER — Encounter: Payer: Self-pay | Admitting: Internal Medicine

## 2024-01-07 MED ORDER — ALPRAZOLAM 0.5 MG PO TABS: 0.5000 mg | ORAL_TABLET | Freq: Two times a day (BID) | ORAL | 1 refills | Status: AC | PRN

## 2024-01-07 NOTE — Telephone Encounter (Signed)
 Refill Xanax  for anxiety as requested. Sent in #60 with one refill for anxiety to take twice daily as needed. MJB, MD

## 2024-01-17 NOTE — Telephone Encounter (Signed)
 done

## 2024-01-28 ENCOUNTER — Other Ambulatory Visit: Payer: Medicare Other

## 2024-01-29 LAB — LIPID PANEL
Cholesterol: 165 mg/dL
HDL: 54 mg/dL
LDL Cholesterol (Calc): 96 mg/dL
Non-HDL Cholesterol (Calc): 111 mg/dL
Total CHOL/HDL Ratio: 3.1 (calc)
Triglycerides: 67 mg/dL

## 2024-01-29 LAB — COMPREHENSIVE METABOLIC PANEL WITH GFR
AG Ratio: 2 (calc) (ref 1.0–2.5)
ALT: 13 U/L (ref 9–46)
AST: 13 U/L (ref 10–35)
Albumin: 4.3 g/dL (ref 3.6–5.1)
Alkaline phosphatase (APISO): 64 U/L (ref 35–144)
BUN: 15 mg/dL (ref 7–25)
CO2: 31 mmol/L (ref 20–32)
Calcium: 9.6 mg/dL (ref 8.6–10.3)
Chloride: 102 mmol/L (ref 98–110)
Creat: 0.79 mg/dL (ref 0.70–1.28)
Globulin: 2.2 g/dL (ref 1.9–3.7)
Glucose, Bld: 90 mg/dL (ref 65–99)
Potassium: 4.8 mmol/L (ref 3.5–5.3)
Sodium: 139 mmol/L (ref 135–146)
Total Bilirubin: 0.9 mg/dL (ref 0.2–1.2)
Total Protein: 6.5 g/dL (ref 6.1–8.1)
eGFR: 91 mL/min/1.73m2

## 2024-01-29 LAB — CBC WITH DIFFERENTIAL/PLATELET
Absolute Lymphocytes: 1425 {cells}/uL (ref 850–3900)
Absolute Monocytes: 587 {cells}/uL (ref 200–950)
Basophils Absolute: 68 {cells}/uL (ref 0–200)
Basophils Relative: 1.2 %
Eosinophils Absolute: 160 {cells}/uL (ref 15–500)
Eosinophils Relative: 2.8 %
HCT: 52.2 % — ABNORMAL HIGH (ref 39.4–51.1)
Hemoglobin: 17 g/dL (ref 13.2–17.1)
MCH: 30.3 pg (ref 27.0–33.0)
MCHC: 32.6 g/dL (ref 31.6–35.4)
MCV: 93 fL (ref 81.4–101.7)
MPV: 9.9 fL (ref 7.5–12.5)
Monocytes Relative: 10.3 %
Neutro Abs: 3460 {cells}/uL (ref 1500–7800)
Neutrophils Relative %: 60.7 %
Platelets: 256 Thousand/uL (ref 140–400)
RBC: 5.61 Million/uL (ref 4.20–5.80)
RDW: 11.8 % (ref 11.0–15.0)
Total Lymphocyte: 25 %
WBC: 5.7 Thousand/uL (ref 3.8–10.8)

## 2024-01-29 LAB — PSA: PSA: 2.63 ng/mL

## 2024-01-29 NOTE — Progress Notes (Signed)
 "  Annual Wellness Visit   Patient Care Team: Harold [name], Harold PARAS, MD as PCP - General (Internal Medicine) Harold Rigg, MD as Consulting Physician (Dermatology) Harold Jerilynn LABOR, MD as Referring Physician (Urology)  Visit Date: 02/12/2024   Chief Complaint  Patient presents with   Annual Exam   Medicare Wellness   Subjective:  Patient: Harold Eickhoff., Male DOB: 03-31-1945, 78 y.o. MRN: [id number] Vitals:   02/12/24 1451  BP: 130/80   Harold Janifer Corean Mickey. is a 78 y.o. Male who presents today for his Annual Wellness Visit. Patient has Hypertension; Anxiety; Herpes simplex type 1 infection; Obesity, unspecified; Erectile dysfunction; Allergic rhinitis; Hereditary and idiopathic peripheral neuropathy; and BPH (benign prostatic hyperplasia) on their problem list.   History of Hypertension treated with Amlodipine  5 mg daily.  Blood pressure is normal at 130/80.   History of Anxiety treated with Xanax  0.5 mg twice daily as needed.   History of Vitamin D deficiency and takes Vitamin D supplement.   History of benign prostatic hyperplasia with urinary obstruction treated with finasteride  5 mg daily, daily tadalafil , tamsulosin . Seen by urologist, Dr. Layman Hurst. 01/28/2024 PSA 2.63.    He underwent surgery for lumbar spinal stenosis at [facility] in November 2022. Radicular symptoms and pain have improved.    Underwent left primary hip arthroplasty at [facility] on 10/11/2023.   Recently seen at [facility] for right cervical radiculopathy and is undergoing PT.   He has remote history of basal cell carcinoma status post Mohs surgery of his right ear.  He is a former smoker and smoked for some 8 years.  He quit in 1974.  Social alcohol consumption.     Longstanding history of numbness in his feet.  He tried Neurontin cream without a lot of relief.  B12 level has been checked previously and was normal.  He has a history of HSV type I infection and uses Valtrex  daily.   History of nummular  dermatitis seen by Dr. Livingston in the remote past.    History of bilateral carpal tunnel surgery 2018 by Dr. Arlyss.    Situational stress with step daughter discussed. She is living in basement apt. At patients's home   He had Hepatitis in 1965.  Fractured nose at age 27.  Tonsillectomy 1952.  Nasal septal deviation surgery February 2004.  Cervical disc surgery April 2007.  Has been treated with Flomax  for BPH and Cialis  for Erectile dysfunction.   Labs 01/28/2024 HCT 52.2, Otherwise WNL.   07/28/2020 Colonoscopy Four 4 to 9 mm polyps in the rectum, in the transverse colon and in the ascending colon. Resected and retrieved. Pathology found to be benign but precancerous.  Non- bleeding external and internal hemorrhoids. Repeat in 3 years.    Vaccine counseling: tetanus vaccine due.    Health maintenance: Colonoscopy due.   Health Maintenance  Topic Date Due   DTaP/Tdap/Td (3 - Td or Tdap) 03/29/2022   Colonoscopy  07/29/2023   Medicare Annual Wellness (AWV)  02/02/2024   Pneumococcal Vaccine: 50+ Years  Completed   Influenza Vaccine  Completed   Meningococcal B Vaccine  Aged Out   COVID-19 Vaccine  Discontinued   Hepatitis C Screening  Discontinued   Zoster Vaccines- Shingrix  Discontinued     Review of Systems  Constitutional:  Negative for fever and malaise/fatigue.  HENT:  Negative for congestion.   Eyes:  Negative for blurred vision.  Respiratory:  Negative for cough and shortness of breath.   Cardiovascular:  Negative  for chest pain, palpitations and leg swelling.  Gastrointestinal:  Negative for vomiting.  Musculoskeletal:  Negative for back pain.  Skin:  Negative for rash.  Neurological:  Negative for loss of consciousness and headaches.   Objective:  Vitals: body mass index is 31.97 kg/m. Today's Vitals   02/12/24 1451  BP: 130/80  Pulse: 64  SpO2: 96%  Weight: 249 lb (112.9 kg)  Height: 6' 2 (1.88 m)  PainSc: 0-No pain   Physical Exam Vitals and nursing  note reviewed. Exam conducted with a chaperone present.  Constitutional:      General: He is awake. He is not in acute distress.    Appearance: Normal appearance. He is not ill-appearing or toxic-appearing.  HENT:     Head: Normocephalic and atraumatic.     Right Ear: Tympanic membrane, ear canal and external ear normal.     Left Ear: Tympanic membrane, ear canal and external ear normal.     Mouth/Throat:     Pharynx: Oropharynx is clear.  Eyes:     Extraocular Movements: Extraocular movements intact.     Pupils: Pupils are equal, round, and reactive to light.  Neck:     Thyroid: No thyroid mass, thyromegaly or thyroid tenderness.     Vascular: No carotid bruit.  Cardiovascular:     Rate and Rhythm: Normal rate and regular rhythm. No extrasystoles are present.    Pulses:          Dorsalis pedis pulses are 2+ on the right side and 2+ on the left side.       Posterior tibial pulses are 2+ on the right side and 2+ on the left side.     Heart sounds: Normal heart sounds. No murmur heard.    No friction rub. No gallop.  Pulmonary:     Effort: Pulmonary effort is normal.     Breath sounds: Normal breath sounds. No decreased breath sounds, wheezing, rhonchi or rales.  Chest:     Chest wall: No mass.  Abdominal:     Palpations: Abdomen is soft. There is no hepatomegaly, splenomegaly or mass.     Tenderness: There is no abdominal tenderness.     Hernia: No hernia is present.  Genitourinary:    Prostate: Normal. Not enlarged, not tender and no nodules present.     Rectum: Normal. Guaiac result negative.  Musculoskeletal:     Cervical back: Normal range of motion.     Right lower leg: No edema.     Left lower leg: No edema.  Lymphadenopathy:     Cervical: No cervical adenopathy.     Upper Body:     Right upper body: No supraclavicular adenopathy.     Left upper body: No supraclavicular adenopathy.  Skin:    General: Skin is warm and dry.  Neurological:     General: No focal  deficit present.     Mental Status: He is alert and oriented to person, place, and time. Mental status is at baseline.     Cranial Nerves: Cranial nerves 2-12 are intact.     Sensory: Sensation is intact.     Motor: Motor function is intact.     Coordination: Coordination is intact.     Gait: Gait is intact.     Deep Tendon Reflexes: Reflexes are normal and symmetric.  Psychiatric:        Attention and Perception: Attention normal.        Mood and Affect: Mood normal.  Speech: Speech normal.        Behavior: Behavior normal. Behavior is cooperative.        Thought Content: Thought content normal.        Cognition and Memory: Cognition and memory normal.        Judgment: Judgment normal.     Current Outpatient Medications  Medication Instructions   acetaminophen (TYLENOL) 500 mg, Every 8 hours PRN   ALPRAZolam  (XANAX ) 0.5 mg, Oral, 2 times daily PRN   amLODipine  (NORVASC ) 5 mg, Oral, Daily   ASPIRIN 81 PO Take by mouth.   finasteride  (PROSCAR ) 5 mg, Oral, Daily   fluticasone (FLONASE) 50 MCG/ACT nasal spray Place into the nose.   tadalafil  (CIALIS ) 5 mg, Oral, Daily   tamsulosin  (FLOMAX ) 0.4 mg, Oral, Daily, Take at same time each day following a meal   valACYclovir  (VALTREX ) 500 MG tablet TAKE 1 TABLET BY MOUTH EVERY DAY   VITAMIN D, CHOLECALCIFEROL, PO Take by mouth.   Past Medical History:  Diagnosis Date   Allergy    Atypical mole    BCC (basal cell carcinoma of skin) 11/20/2017   right ear rim Mohs   Dependent edema    Granuloma annulare    Hyperlipidemia    Hypertension    Vitamin D deficiency    Medical/Surgical History Narrative:  Allergic/Intolerant to: Allergies[1]  Past Surgical History:  Procedure Laterality Date   Birthmark removed  02/06/1946   at [facility]   CATARACT EXTRACTION, BILATERAL  04/25/2012   CERVICAL DISC SURGERY  02/06/2002   COLONOSCOPY  06/03/2002   scope not long enough per pt.   KNEE SURGERY  02/06/2006   NASAL SEPTUM SURGERY      RETINAL DETACHMENT SURGERY Right 03/16/2015   SHOULDER ARTHROSCOPY  04/29/2009   subacromial decompression, labral debridement, rotator cuff debridement, open reconstruction of complex rotator interval/supraspinatus rotator cuff tear   TONSILLECTOMY     Family History  Problem Relation Age of Onset   Heart disease Father    Diabetes Father    Hypertension Father    Kidney disease Sister    Colon cancer Neg Hx    Colon polyps Neg Hx    Esophageal cancer Neg Hx    Rectal cancer Neg Hx    Stomach cancer Neg Hx    Family History Narrative: Father died at age 65 with MI with history of diabetes and hypertension. Mother died of complications of Alzheimer's disease. 1 brother in good health. 1 sister with nephrotic syndrome.     Social History : He is retired. 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 He resides in [place].  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  He used to smoke but quit around 1997/03/15.  Social alcohol consumption.  Son deceased in 03/15/16.  He has sons in [address] and [place]  .  He enjoys golf.  He has a boat and does some fishing. Married.   Most Recent Health Risks Assessment:   Most Recent Social Determinants of Health (Including Hx of Tobacco, Alcohol, and Drug Use) SDOH Screenings   Food Insecurity: No Food Insecurity (12/31/2023)   Received from [facility]  Housing: Low Risk (10/31/2023)  Transportation Needs: No Transportation Needs (12/31/2023)   [facility] from [facility]  Utilities: Not At Risk (10/11/2023)   Received from [facility]  Alcohol Screen: Low Risk (10/31/2023)  Depression (PHQ2-9): Low Risk (02/02/2023)  Financial  Resource Strain: Low Risk  (10/11/2023)   Received from [facility]  Physical Activity: Inactive (10/31/2023)  Social Connections: Unknown (10/31/2023)  Stress: No Stress Concern Present (10/31/2023)   Tobacco Use: Medium Risk (02/12/2024)  Health Literacy: Adequate Health Literacy (02/02/2023)      Most Recent Fall Risk Assessment:    02/02/2023   10:47 AM  Fall Risk   Falls in the past year? 0  Number falls in past yr: 0  Injury with Fall? 0   Risk for fall due to : No Fall Risks  Follow up Education provided;Falls evaluation completed;Falls prevention discussed     Data saved with a previous flowsheet row definition   Most Recent Anxiety/Depression Screenings:    02/02/2023   10:48 AM 01/23/2022   11:05 AM  PHQ 2/9 Scores  PHQ - 2 Score 0 0    Most Recent Cognitive Screening:    02/02/2023   10:48 AM  6CIT Screen  What Year? 0 points  What month? 0 points  What time? 0 points  Count back from 20 0 points  Months in reverse 0 points  Repeat phrase 0 points  Total Score 0 points   Results:  Studies Obtained And Personally Reviewed By Me:  07/28/2020 Colonoscopy Four 4 to 9 mm polyps in the rectum, in the transverse colon and in the ascending colon. Resected and retrieved. Pathology found to be benign but precancerous.  Non- bleeding external and internal hemorrhoids. Repeat in 3 years.    Labs:  CBC w/ Differential Lab Results  Component Value Date   WBC 5.7 01/28/2024   RBC 5.61 01/28/2024   HGB 17.0 01/28/2024   HCT 52.2 (H) 01/28/2024   PLT 256 01/28/2024   MCV 93.0 01/28/2024   MCH 30.3 01/28/2024   MCHC 32.6 01/28/2024   RDW 11.8 01/28/2024   MPV 9.9 01/28/2024   LYMPHSABS 1,600 01/20/2022   MONOABS 488 09/12/2016   BASOSABS 68 01/28/2024    Comprehensive Metabolic Panel Lab Results  Component Value Date   NA 139 01/28/2024   K 4.8 01/28/2024   CL 102 01/28/2024   CO2 31 01/28/2024   GLUCOSE 90 01/28/2024   BUN 15 01/28/2024   CREATININE 0.79 01/28/2024   CALCIUM 9.6 01/28/2024   PROT 6.5 01/28/2024   ALBUMIN 4.1 09/12/2016   AST 13 01/28/2024   ALT 13 01/28/2024   ALKPHOS 60 09/12/2016   BILITOT 0.9 01/28/2024   EGFR 91  01/28/2024   GFRNONAA 86 11/21/2019   Lipid Panel  Lab Results  Component Value Date   CHOL 165 01/28/2024   HDL 54 01/28/2024   LDLCALC 96 01/28/2024   TRIG 67 01/28/2024   A1c Lab Results  Component Value Date   HGBA1C 5.0 07/15/2021    TSH Lab Results  Component Value Date   TSH 0.71 11/01/2023   PSA 2.63  Assessment & Plan:   Orders Placed This Encounter  Procedures   Ambulatory referral to Gastroenterology    Referral Priority:   Routine    Referral Type:   Consultation    Referral Reason:   Specialty Services Required    Number of Visits Requested:   1   Hypertension: treated with Amlodipine  5 mg daily.  Blood pressure is normal at 130/80.   Anxiety: treated with Xanax  0.5 mg twice daily as needed.   Vitamin D deficiency:  takes Vitamin D supplement.  Right cervical radiculopathy - has been evaluated at [facility] and receiving PT  Benign prostatic hyperplasia:  treated with Finasteride  5 mg daily, daily tadalafil , tamsulosin . Seen by Urologist, Dr. Layman Hurst. 01/28/2024 PSA 2.63.   07/28/2020 Colonoscopy revealed  Four 4 to 9 mm polyps in the rectum, in the transverse colon and in the ascending colon. Resected and retrieved. Pathology found to be benign but precancerous.  Non- bleeding external and internal hemorrhoids. Repeat in 3 years.    Vaccine counseling: Tetanus  immunization due.  May obtain at pharmacy of choice.  Health maintenance: Colonoscopy due.   Colonoscopy referral placed.  Plan: Continue  annual Urology follow up. Receiving PT for right cervical radiculopathy. Situational stress discussed. Referral made for screening colonoscopy.   Annual Wellness Visit done today including the all of the following: Reviewed patient's Family Medical History Reviewed patient's SDOH and reviewed tobacco, alcohol, and drug use.  Reviewed and updated list of patient's medical providers Assessment of cognitive impairment was done Assessed patient's  functional ability Established a written schedule for health screening services Health Risk Assessent Completed and Reviewed  Discussed health benefits of physical activity, and encouraged him to engage in regular exercise appropriate for his age and condition.    I,Makayla C Reid,acting as a scribe for Harold JINNY Hailstone, MD.,have documented all relevant documentation on the behalf of Harold JINNY Hailstone, MD,as directed by  Harold JINNY Hailstone, MD while in the presence of Harold JINNY Hailstone, MD.  I, Harold JINNY Hailstone, MD, have reviewed all documentation for and agree with the above Annual Wellness Visit documentation.  Harold JINNY Hailstone, MD Internal Medicine 02/12/2024     [1]  Allergies Allergen Reactions   Celebrex [Celecoxib]    Cephalosporins    Cephalexin Rash   "

## 2024-01-31 ENCOUNTER — Ambulatory Visit: Payer: Self-pay | Admitting: Internal Medicine

## 2024-02-04 ENCOUNTER — Ambulatory Visit: Payer: Medicare Other

## 2024-02-04 ENCOUNTER — Ambulatory Visit: Payer: Medicare Other | Admitting: Internal Medicine

## 2024-02-12 ENCOUNTER — Ambulatory Visit: Admitting: Internal Medicine

## 2024-02-12 ENCOUNTER — Encounter: Payer: Self-pay | Admitting: Internal Medicine

## 2024-02-12 VITALS — BP 130/80 | HR 64 | Ht 74.0 in | Wt 249.0 lb

## 2024-02-12 DIAGNOSIS — Z96642 Presence of left artificial hip joint: Secondary | ICD-10-CM

## 2024-02-12 DIAGNOSIS — Z860101 Personal history of adenomatous and serrated colon polyps: Secondary | ICD-10-CM

## 2024-02-12 DIAGNOSIS — F419 Anxiety disorder, unspecified: Secondary | ICD-10-CM | POA: Diagnosis not present

## 2024-02-12 DIAGNOSIS — N400 Enlarged prostate without lower urinary tract symptoms: Secondary | ICD-10-CM | POA: Diagnosis not present

## 2024-02-12 DIAGNOSIS — M1712 Unilateral primary osteoarthritis, left knee: Secondary | ICD-10-CM

## 2024-02-12 DIAGNOSIS — I10 Essential (primary) hypertension: Secondary | ICD-10-CM | POA: Diagnosis not present

## 2024-02-12 DIAGNOSIS — G609 Hereditary and idiopathic neuropathy, unspecified: Secondary | ICD-10-CM

## 2024-02-12 DIAGNOSIS — Z0000 Encounter for general adult medical examination without abnormal findings: Secondary | ICD-10-CM | POA: Diagnosis not present

## 2024-02-12 DIAGNOSIS — E559 Vitamin D deficiency, unspecified: Secondary | ICD-10-CM | POA: Diagnosis not present

## 2024-02-12 DIAGNOSIS — F439 Reaction to severe stress, unspecified: Secondary | ICD-10-CM

## 2024-02-12 DIAGNOSIS — Z1211 Encounter for screening for malignant neoplasm of colon: Secondary | ICD-10-CM

## 2024-02-12 DIAGNOSIS — M5412 Radiculopathy, cervical region: Secondary | ICD-10-CM

## 2024-02-12 NOTE — Patient Instructions (Addendum)
 Harold [name],  Thank you for taking the time for your Medicare Wellness Visit. I appreciate your continued commitment to your health goals. Please review the care plan we discussed, and feel free to reach out if I can assist you further.  Please note that Annual Wellness Visits do not include a physical exam. Some assessments may be limited, especially if the visit was conducted virtually. If needed, we may recommend an in-person follow-up with your provider.  Ongoing Care Seeing your primary care provider every 3 to 6 months helps us  monitor your health and provide consistent, personalized care.   Referrals If a referral was made during today's visit and you haven't received any updates within two weeks, please contact the referred provider directly to check on the status.  Recommended Screenings:  Health Maintenance  Topic Date Due   DTaP/Tdap/Td vaccine (3 - Td or Tdap) 03/29/2022   Colon Cancer Screening  07/29/2023   Medicare Annual Wellness Visit  02/02/2024   Pneumococcal Vaccine for age over 45  Completed   Flu Shot  Completed   Meningitis B Vaccine  Aged Out   COVID-19 Vaccine  Discontinued   Hepatitis C Screening  Discontinued   Zoster (Shingles) Vaccine  Discontinued       02/12/2024    4:14 PM  Advanced Directives  Does Patient Have a Medical Advance Directive? Yes  Type of Advance Directive Living will;Healthcare Power of Attorney  Does patient want to make changes to medical advance directive? No - Patient declined  Copy of Healthcare Power of Attorney in Chart? No - copy requested    Vision: Annual vision screenings are recommended for early detection of glaucoma, cataracts, and diabetic retinopathy. These exams can also reveal signs of chronic conditions such as diabetes and high blood pressure.  Dental: Annual dental screenings help detect early signs of oral cancer, gum disease, and other conditions linked to overall health, including heart disease and  diabetes.  Please see the attached documents for additional preventive care recommendations.   Referral placed to [facility] for screening colonoscopy.

## 2024-02-12 NOTE — Progress Notes (Signed)
 "  Chief Complaint  Patient presents with   Annual Exam   Medicare Wellness     Subjective:   Harold [name]. is a 79 y.o. male who presents for a Medicare Annual Wellness Visit.  Visit info / Clinical Intake: Medicare Wellness Visit Type:: Subsequent Annual Wellness Visit Persons participating in visit and providing information:: patient Medicare Wellness Visit Mode:: In-person (required for WTM) Interpreter Needed?: No Pre-visit prep was completed: yes AWV questionnaire completed by patient prior to visit?: no Patient's Overall Health Status Rating: good Typical amount of pain: some Does pain affect daily life?: (!) yes Are you currently prescribed opioids?: no  Dietary Habits and Nutritional Risks How many meals a day?: 3 Eats fruit and vegetables daily?: yes Most meals are obtained by: preparing own meals In the last 2 weeks, have you had any of the following?: none Diabetic:: no  Functional Status Activities of Daily Living (to include ambulation/medication): Independent Ambulation: Independent Medication Administration: Independent Home Management (perform basic housework or laundry): Independent Manage your own finances?: yes Primary transportation is: driving Concerns about vision?: (!) yes Concerns about hearing?: no  Fall Screening Falls in the past year?: 0 Number of falls in past year: 0 Was there an injury with Fall?: 0 Fall Risk Category Calculator: 0 Patient Fall Risk Level: Low Fall Risk  Fall Risk Patient at Risk for Falls Due to: No Fall Risks Fall risk Follow up: Falls prevention discussed; Education provided; Falls evaluation completed  Home and Transportation Safety: All rugs have non-skid backing?: yes All stairs or steps have railings?: yes Grab bars in the bathtub or shower?: yes Have non-skid surface in bathtub or shower?: (!) no Good home lighting?: yes Regular seat belt use?: yes Hospital stays in the last year:: (!) yes How  many hospital stays:: 1 Reason: hip replacement  Cognitive Assessment Difficulty concentrating, remembering, or making decisions? : no Will 6CIT or Mini Cog be Completed: no 6CIT or Mini Cog Declined: patient alert, oriented, able to answer questions appropriately and recall recent events  Advance Directives (For Healthcare) Does Patient Have a Medical Advance Directive?: Yes Does patient want to make changes to medical advance directive?: No - Patient declined Type of Advance Directive: Living will; Healthcare Power of Attorney Copy of Healthcare Power of Attorney in Chart?: No - copy requested Copy of Living Will in Chart?: No - copy requested  Reviewed/Updated  Reviewed/Updated: Reviewed All (Medical, Surgical, Family, Medications, Allergies, Care Teams, Patient Goals)    Allergies (verified) Celebrex [celecoxib], Cephalosporins, and Cephalexin   Current Medications (verified) Outpatient Encounter Medications as of 02/12/2024  Medication Sig   acetaminophen (TYLENOL) 500 MG tablet Take 500 mg by mouth every 8 (eight) hours as needed.   ALPRAZolam  (XANAX ) 0.5 MG tablet Take 1 tablet (0.5 mg total) by mouth 2 (two) times daily as needed for anxiety.   amLODipine  (NORVASC ) 5 MG tablet Take 1 tablet (5 mg total) by mouth daily.   ASPIRIN 81 PO Take by mouth.   finasteride  (PROSCAR ) 5 MG tablet Take 1 tablet (5 mg total) by mouth daily.   fluticasone (FLONASE) 50 MCG/ACT nasal spray Place into the nose.   tadalafil  (CIALIS ) 5 MG tablet Take 1 tablet (5 mg total) by mouth daily.   tamsulosin  (FLOMAX ) 0.4 MG CAPS capsule Take 1 capsule (0.4 mg total) by mouth daily. Take at same time each day following a meal   valACYclovir  (VALTREX ) 500 MG tablet TAKE 1 TABLET BY MOUTH EVERY DAY  VITAMIN D, CHOLECALCIFEROL, PO Take by mouth.   [DISCONTINUED] triamcinolone  cream (KENALOG ) 0.1 % Apply 1 application topically daily as needed. (Patient not taking: Reported on 11/01/2023)   No  facility-administered encounter medications on file as of 02/12/2024.    History: Past Medical History:  Diagnosis Date   Allergy    Atypical mole    BCC (basal cell carcinoma of skin) 11/20/2017   right ear rim Mohs   Dependent edema    Granuloma annulare    Hyperlipidemia    Hypertension    Vitamin D deficiency    Past Surgical History:  Procedure Laterality Date   Birthmark removed  [birth year]1946   at [facility]   CATARACT EXTRACTION, BILATERAL  04/25/2012   CERVICAL DISC SURGERY  02/06/2002   COLONOSCOPY  06/03/2002   scope not long enough per pt.   KNEE SURGERY  02/06/2006   NASAL SEPTUM SURGERY     RETINAL DETACHMENT SURGERY Right 2017   SHOULDER ARTHROSCOPY  04/29/2009   subacromial decompression, labral debridement, rotator cuff debridement, open reconstruction of complex rotator interval/supraspinatus rotator cuff tear   TONSILLECTOMY     Family History  Problem Relation Age of Onset   Heart disease Father    Diabetes Father    Hypertension Father    Kidney disease Sister    Colon cancer Neg Hx    Colon polyps Neg Hx    Esophageal cancer Neg Hx    Rectal cancer Neg Hx    Stomach cancer Neg Hx    Social History   Occupational History   Not on file  Tobacco Use   Smoking status: Former    Current packs/day: 0.00    Average packs/day: 0.3 packs/day    Types: Cigarettes    Quit date: 02/06/1997    Years since quitting: 27.0   Smokeless tobacco: Never  Vaping Use   Vaping status: Never Used  Substance and Sexual Activity   Alcohol use: Yes    Alcohol/week: 7.0 standard drinks of alcohol    Types: 7 Glasses of wine per week   Drug use: No   Sexual activity: Yes   Tobacco Counseling Counseling given: No  SDOH Screenings   Food Insecurity: No Food Insecurity (02/12/2024)  Housing: Low Risk (02/12/2024)  Transportation Needs: No Transportation Needs (02/12/2024)  Utilities: Not At Risk (02/12/2024)  Alcohol Screen: Low Risk (02/12/2024)  Depression (PHQ2-9): Low  Risk (02/12/2024)  Financial Resource Strain: Low Risk (02/12/2024)  Physical Activity: Inactive (02/12/2024)  Social Connections: Socially Integrated (02/12/2024)  Stress: No Stress Concern Present (02/12/2024)  Tobacco Use: Medium Risk (02/12/2024)  Health Literacy: Adequate Health Literacy (02/12/2024)   See flowsheets for full screening details  Depression Screen PHQ 2 & 9 Depression Scale- Over the past 2 weeks, how often have you been bothered by any of the following problems? Little interest or pleasure in doing things: 0 Feeling down, depressed, or hopeless (PHQ Adolescent also includes...irritable): 0 PHQ-2 Total Score: 0     Goals Addressed               This Visit's Progress     Activity and Exercise Increased (pt-stated)        Evidence-based guidance:  Review current exercise levels.  Assess patient perspective on exercise or activity level, barriers to increasing activity, motivation and readiness for change.  Recommend or set healthy exercise goal based on individual tolerance.  Encourage small steps toward making change in amount of exercise or activity.  Urge reduction of  sedentary activities or screen time.  Promote group activities within the community or with family or support person.  Consider referral to rehabiliation therapist for assessment and exercise/activity plan.   Notes:              Objective:    Today's Vitals   02/12/24 1451  BP: 130/80  Pulse: 64  SpO2: 96%  Weight: 249 lb (112.9 kg)  Height: 6' 2 (1.88 m)  PainSc: 0-No pain   Body mass index is 31.97 kg/m.  Hearing/Vision screen No results found. Immunizations and Health Maintenance Health Maintenance  Topic Date Due   DTaP/Tdap/Td (3 - Td or Tdap) 03/29/2022   Colonoscopy  07/29/2023   Medicare Annual Wellness (AWV)  02/11/2025   Pneumococcal Vaccine: 50+ Years  Completed   Influenza Vaccine  Completed   Meningococcal B Vaccine  Aged Out   COVID-19 Vaccine  Discontinued    Hepatitis C Screening  Discontinued   Zoster Vaccines- Shingrix  Discontinued        Assessment/Plan:  This is a routine wellness examination for Harold [name].  Patient Care Team: Perri Ronal PARAS, MD as PCP - General (Internal Medicine) Livingston Rigg, MD as Consulting Physician (Dermatology) Alfonzo Jerilynn LABOR, MD as Referring Physician (Urology)  I have personally reviewed and noted the following in the patients chart:   Medical and social history Use of alcohol, tobacco or illicit drugs  Current medications and supplements including opioid prescriptions. Functional ability and status Nutritional status Physical activity Advanced directives List of other physicians Hospitalizations, surgeries, and ER visits in previous 12 months Vitals Screenings to include cognitive, depression, and falls Referrals and appointments  Orders Placed This Encounter  Procedures   Ambulatory referral to Gastroenterology    Referral Priority:   Routine    Referral Type:   Consultation    Referral Reason:   Specialty Services Required    Number of Visits Requested:   1   In addition, I have reviewed and discussed with patient certain preventive protocols, quality metrics, and best practice recommendations. A written personalized care plan for preventive services as well as general preventive health recommendations were provided to patient.   Caitlyne Ingham, CMA   02/12/2024   Return in 1 year (on 02/11/2025).  After Visit Summary: (In Person-Printed) AVS printed and given to the patient  Nurse Notes: none  I, Ronal PARAS Perri, MD, have reviewed all documentation for this visit. The documentation on 02/12/2024 for the exam, diagnosis, procedures, and orders are all accurate and complete.  "

## 2024-02-17 ENCOUNTER — Encounter: Payer: Self-pay | Admitting: Internal Medicine

## 2024-02-18 ENCOUNTER — Encounter: Payer: Self-pay | Admitting: Urology

## 2024-02-18 ENCOUNTER — Ambulatory Visit: Payer: No Typology Code available for payment source | Admitting: Urology

## 2024-02-18 VITALS — BP 123/75 | HR 80 | Ht 74.0 in | Wt 240.0 lb

## 2024-02-18 DIAGNOSIS — R972 Elevated prostate specific antigen [PSA]: Secondary | ICD-10-CM | POA: Diagnosis not present

## 2024-02-18 DIAGNOSIS — N401 Enlarged prostate with lower urinary tract symptoms: Secondary | ICD-10-CM | POA: Diagnosis not present

## 2024-02-18 DIAGNOSIS — N138 Other obstructive and reflux uropathy: Secondary | ICD-10-CM | POA: Diagnosis not present

## 2024-02-18 DIAGNOSIS — N529 Male erectile dysfunction, unspecified: Secondary | ICD-10-CM | POA: Diagnosis not present

## 2024-02-18 LAB — URINALYSIS, ROUTINE W REFLEX MICROSCOPIC
Bilirubin, UA: NEGATIVE
Glucose, UA: NEGATIVE
Ketones, UA: NEGATIVE
Leukocytes,UA: NEGATIVE
Nitrite, UA: NEGATIVE
Protein,UA: NEGATIVE
RBC, UA: NEGATIVE
Specific Gravity, UA: 1.02 (ref 1.005–1.030)
Urobilinogen, Ur: 0.2 mg/dL (ref 0.2–1.0)
pH, UA: 5.5 (ref 5.0–7.5)

## 2024-02-18 MED ORDER — TADALAFIL 5 MG PO TABS: 5.0000 mg | ORAL_TABLET | Freq: Every day | ORAL | 3 refills | Status: AC

## 2024-02-18 MED ORDER — FINASTERIDE 5 MG PO TABS: 5.0000 mg | ORAL_TABLET | Freq: Every day | ORAL | 3 refills | Status: AC

## 2024-02-18 MED ORDER — TAMSULOSIN HCL 0.4 MG PO CAPS: 0.4000 mg | ORAL_CAPSULE | Freq: Every day | ORAL | 3 refills | Status: AC

## 2024-02-18 NOTE — Progress Notes (Signed)
 "  Assessment: 1. Benign prostatic hyperplasia with urinary obstruction   2. Erectile dysfunction of organic origin   3. Elevated PSA     Plan: I personally reviewed the patient's chart including provider notes, lab results. Continue current management with tamsulosin  0.4 mg daily, tadalafil  5 mg daily, and finasteride  5 mg daily. Return to office in 1 year.  Chief Complaint: Chief Complaint  Patient presents with   Benign Prostatic Hypertrophy    HPI: Harold [name]. is a 79 y.o. male who presents for continued evaluation of BPH with lower urinary tract symptoms, elevated PSA, and erectile dysfunction. He was previously seen by Dr. Shona with his last visit in January 2025.  Prior to that he was followed by Dr. Alfonzo with [facility] urology. He has a long history of BPH with lower urinary tract symptoms.  He has been managed with combination therapy with daily tadalafil  and tamsulosin .  At his initial visit in March 2024, he reported worsening lower urinary tract symptoms.  IPSS = 14.  He was started on finasteride .  He subsequently noted improvement in his lower urinary tract symptoms.  IPSS = 8 in January 2025.  He has a history of elevated PSA. PSA data: 09/2016                         3.3 11/2017                       3.9 11/2018                       3.2 11/2019                       3.71 11/2020                       4.33 01/2022                       5.09  started finasteride  04/2022 01/2023                       2.19 12/25    2.63  His erectile dysfunction has improved with the daily tadalafil .  He presents today for follow-up.  He continues on tadalafil  5 mg daily, Flomax  0.4 mg daily, and finasteride  5 mg daily.  His lower urinary tract symptoms are well-controlled.  No dysuria or gross hematuria. IPSS = 4/0.  Portions of the above documentation were copied from a prior visit for review purposes only.  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   KNEE SURGERY  02/06/2006   NASAL SEPTUM SURGERY     RETINAL DETACHMENT SURGERY Right 2017   SHOULDER ARTHROSCOPY  04/29/2009   subacromial decompression, labral debridement, rotator cuff debridement, open  reconstruction of complex rotator interval/supraspinatus rotator cuff tear   TONSILLECTOMY      SH: Social History[2]  ROS: Constitutional:  Negative for fever, chills, weight loss CV: Negative for chest pain, previous MI, hypertension Respiratory:  Negative for shortness of breath, wheezing, sleep apnea, frequent cough GI:  Negative for nausea, vomiting, bloody stool, GERD  PE: BP 123/75   Pulse 80   Ht 6' 2 (1.88 m)   Wt 240 lb (108.9 kg)   BMI 30.81 kg/m  GENERAL APPEARANCE:  Well appearing, well developed, well nourished, NAD HEENT:  Atraumatic, normocephalic, oropharynx clear NECK:  Supple without lymphadenopathy or thyromegaly ABDOMEN:  Soft, non-tender, no masses EXTREMITIES:  Moves all extremities well, without clubbing, cyanosis, or edema NEUROLOGIC:  Alert and oriented x 3, normal gait, CN II-XII grossly intact MENTAL STATUS:  appropriate BACK:  Non-tender to palpation, No CVAT SKIN:  Warm, dry, and intact GU: Prostate: 40 g, NT, no nodules Rectum: Normal tone,  no masses or tenderness    Results: U/A: negative     [1]  Allergies Allergen Reactions   Celebrex [Celecoxib]    Cephalosporins    Cephalexin Rash  [2]  Social History Tobacco Use   Smoking status: Former    Current  packs/day: 0.00    Average packs/day: 0.3 packs/day    Types: Cigarettes    Quit date: 02/06/1997    Years since quitting: 27.0   Smokeless tobacco: Never  Vaping Use   Vaping status: Never Used  Substance Use Topics   Alcohol use: Yes    Alcohol/week: 7.0 standard drinks of alcohol    Types: 7 Glasses of wine per week   Drug use: No   "
